# Patient Record
Sex: Female | Born: 1950 | Race: Black or African American | Hispanic: No | Marital: Married | Smoking: Never smoker
Health system: Southern US, Community
[De-identification: ages and names within clinical notes are randomized; demographics above are authoritative.]

## PROBLEM LIST (undated history)

## (undated) DIAGNOSIS — I10 Essential (primary) hypertension: Secondary | ICD-10-CM

## (undated) HISTORY — PX: CORNEAL TRANSPLANT: SHX108

## (undated) HISTORY — PX: ABDOMINAL HYSTERECTOMY: SHX81

## (undated) HISTORY — DX: Essential (primary) hypertension: I10

---

## 2010-03-05 ENCOUNTER — Ambulatory Visit: Payer: Self-pay

## 2010-03-15 ENCOUNTER — Ambulatory Visit: Payer: Self-pay

## 2010-11-19 ENCOUNTER — Emergency Department (HOSPITAL_COMMUNITY)
Admission: EM | Admit: 2010-11-19 | Discharge: 2010-11-19 | Disposition: A | Discharge status: Home / Self Care | Payer: BC Managed Care – PPO | Attending: Emergency Medicine | Admitting: Emergency Medicine

## 2010-11-19 DIAGNOSIS — L723 Sebaceous cyst: Secondary | ICD-10-CM | POA: Insufficient documentation

## 2010-11-19 DIAGNOSIS — I10 Essential (primary) hypertension: Secondary | ICD-10-CM | POA: Insufficient documentation

## 2012-01-05 ENCOUNTER — Ambulatory Visit: Payer: Self-pay | Admitting: Emergency Medicine

## 2012-05-27 ENCOUNTER — Ambulatory Visit: Payer: Self-pay | Admitting: Oncology

## 2012-07-04 ENCOUNTER — Ambulatory Visit: Payer: Self-pay | Admitting: Oncology

## 2012-07-04 LAB — COMPREHENSIVE METABOLIC PANEL
BUN: 12 mg/dL (ref 7–18)
Bilirubin,Total: 0.4 mg/dL (ref 0.2–1.0)
Calcium, Total: 9.8 mg/dL (ref 8.5–10.1)
Chloride: 99 mmol/L (ref 98–107)
Co2: 28 mmol/L (ref 21–32)
EGFR (African American): >60
Potassium: 3.7 mmol/L (ref 3.5–5.1)
SGOT(AST): 23 U/L (ref 15–37)
SGPT (ALT): 32 U/L (ref 12–78)
Total Protein: 8.5 g/dL — ABNORMAL HIGH (ref 6.4–8.2)

## 2012-07-04 LAB — CBC CANCER CENTER
Basophil #: 0.1 x10 3/mm (ref 0.0–0.1)
Basophil %: 1.1 %
Eosinophil #: 0.2 x10 3/mm (ref 0.0–0.7)
HGB: 14.3 g/dL (ref 12.0–16.0)
Lymphocyte #: 2 x10 3/mm (ref 1.0–3.6)
Lymphocyte %: 36.2 %
MCH: 28 pg (ref 26.0–34.0)
MCHC: 32.9 g/dL (ref 32.0–36.0)
MCV: 85 fL (ref 80–100)
Monocyte #: 0.5 x10 3/mm (ref 0.2–0.9)
Monocyte %: 8.1 %
Neutrophil #: 2.9 x10 3/mm (ref 1.4–6.5)
Neutrophil %: 51.7 %
Platelet: 216 x10 3/mm (ref 150–440)
RBC: 5.12 10*6/uL (ref 3.80–5.20)
RDW: 14.2 % (ref 11.5–14.5)

## 2012-07-04 LAB — IRON AND TIBC
Iron Saturation: 28 %
Iron: 90 ug/dL (ref 50–170)
Unbound Iron-Bind.Cap.: 237 ug/dL

## 2012-07-04 LAB — FERRITIN: Ferritin (ARMC): 328 ng/mL (ref 8–388)

## 2012-07-06 ENCOUNTER — Ambulatory Visit: Payer: Self-pay | Admitting: Oncology

## 2012-08-01 LAB — CBC CANCER CENTER
Basophil #: 0.1 x10 3/mm (ref 0.0–0.1)
Basophil %: 1.2 %
Eosinophil #: 0.2 x10 3/mm (ref 0.0–0.7)
Eosinophil %: 3.8 %
Lymphocyte %: 41.4 %
MCH: 27.8 pg (ref 26.0–34.0)
MCV: 85 fL (ref 80–100)
Monocyte #: 0.6 x10 3/mm (ref 0.2–0.9)
Neutrophil #: 2.9 x10 3/mm (ref 1.4–6.5)
Neutrophil %: 44.3 %
Platelet: 211 x10 3/mm (ref 150–440)
RBC: 4.91 10*6/uL (ref 3.80–5.20)
WBC: 6.5 x10 3/mm (ref 3.6–11.0)

## 2012-08-03 ENCOUNTER — Ambulatory Visit: Payer: Self-pay | Admitting: Oncology

## 2014-09-04 ENCOUNTER — Encounter (HOSPITAL_COMMUNITY): Payer: Self-pay

## 2014-09-04 ENCOUNTER — Emergency Department (HOSPITAL_COMMUNITY)
Admission: EM | Admit: 2014-09-04 | Discharge: 2014-09-04 | Disposition: A | Discharge status: Home / Self Care | Payer: BC Managed Care – PPO | Attending: Emergency Medicine | Admitting: Emergency Medicine

## 2014-09-04 DIAGNOSIS — I10 Essential (primary) hypertension: Secondary | ICD-10-CM | POA: Insufficient documentation

## 2014-09-04 DIAGNOSIS — G4489 Other headache syndrome: Secondary | ICD-10-CM | POA: Diagnosis not present

## 2014-09-04 DIAGNOSIS — Z79899 Other long term (current) drug therapy: Secondary | ICD-10-CM | POA: Diagnosis not present

## 2014-09-04 DIAGNOSIS — Z7982 Long term (current) use of aspirin: Secondary | ICD-10-CM | POA: Insufficient documentation

## 2014-09-04 DIAGNOSIS — R51 Headache: Secondary | ICD-10-CM | POA: Diagnosis present

## 2014-09-04 HISTORY — DX: Essential (primary) hypertension: I10

## 2014-09-04 NOTE — ED Provider Notes (Signed)
CSN: [id number]     Arrival date & time 09/04/14  2133 History  This chart was scribed for Ripley Fraise, MD by Eustaquio Maize, ED Scribe. This patient was seen in room APA15/APA15 and the patient's care was started at 11:10 PM.    Chief Complaint  Patient presents with  . Headache    Patient is a 64 y.o. female presenting with headaches. The history is provided by the patient. No language interpreter was used.  Headache Pain location:  Occipital Quality:  Unable to specify Radiates to:  Does not radiate Severity currently:  5/10 Severity at highest:  Unable to specify Onset quality:  Sudden Duration:  1 day Timing:  Constant Chronicity:  New Associated symptoms: no abdominal pain, no blurred vision, no dizziness, no fever, no nausea, no visual change, no vomiting and no weakness    HPI Comments: Renee Boone is a 64 y.o. female with PMHx HTN who presents to the Emergency Department complaining of right sided head pain that began 1 day ago. Pt states that she was sitting down when the pain began. Pt reports that the pain is a 5/10 on the pain scale. She also mentions a knot on her head that she noticed 1 week ago. She denies pain radiation or injury. No fever, nausea, vomiting, visual changes, slurred speech, weakness in extremities, dizziness, chest pain, abdominal pain, or any other symptoms. Pt states she has never had these symptoms before.   Past Medical History  Diagnosis Date  . Hypertension    History reviewed. No pertinent past surgical history. No family history on file. History  Substance Use Topics  . Smoking status: Never Smoker   . Smokeless tobacco: Not on file  . Alcohol Use: No   OB History    No data available     Review of Systems  Constitutional: Negative for fever.  Eyes: Negative for blurred vision.  Cardiovascular: Negative for chest pain.  Gastrointestinal: Negative for nausea, vomiting and abdominal pain.  Neurological: Positive for headaches.  Negative for dizziness, speech difficulty and weakness.  All other systems reviewed and are negative.     Allergies  Review of patient's allergies indicates no known allergies.  Home Medications   Prior to Admission medications   Medication Sig Start Date End Date Taking? Authorizing Provider  aspirin EC 81 MG tablet Take 81 mg by mouth daily as needed for mild pain.   Yes Historical Provider, MD  Ergocalciferol (VITAMIN D2) 400 UNITS TABS Take 800 mcg by mouth daily.   Yes Historical Provider, MD  Multiple Vitamin (MULTIVITAMIN WITH MINERALS) TABS tablet Take 1 tablet by mouth daily.   Yes Historical Provider, MD  triamterene-hydrochlorothiazide (MAXZIDE-25) 37.5-25 MG per tablet Take 1 tablet by mouth daily. 08/19/14  Yes Historical Provider, MD   Triage Vitals: BP 152/84 mmHg  Pulse 67  Temp(Src) 98.2 F (36.8 C) (Oral)  Resp 18  Ht 5\' 4"  (1.626 m)  Wt 176 lb (79.833 kg)  BMI 30.20 kg/m2  SpO2 99%   Physical Exam  CONSTITUTIONAL: Well developed/well nourished HEAD: Normocephalic/atraumatic, no signs of trauma.  No edema. No abscess.  No lymphadenopathy EYES: EOMI/PERRL, no nystagmus, no ptosis ENMT: Mucous membranes moist NECK: supple no meningeal signs, no bruits SPINE/BACK:entire spine nontender CV: S1/S2 noted, no murmurs/rubs/gallops noted LUNGS: Lungs are clear to auscultation bilaterally, no apparent distress ABDOMEN: soft, nontender, no rebound or guarding GU:no cva tenderness NEURO:Awake/alert, facies symmetric, no arm or leg drift is noted Equal 5/5 strength with shoulder  abduction, elbow flex/extension, wrist flex/extension in upper extremities and equal hand grips bilaterally Equal 5/5 strength with hip flexion,knee flex/extension, foot dorsi/plantar flexion Cranial nerves 3/4/5/6/12/11/08/11/12 tested and intact Gait normal without ataxia No past pointing Sensation to light touch intact in all extremities EXTREMITIES: pulses normal, full ROM SKIN: warm,  color normal PSYCH: no abnormalities of mood noted, alert and oriented to situation   ED Course  Procedures   DIAGNOSTIC STUDIES: Oxygen Saturation is 99% on RA, normal by my interpretation.    COORDINATION OF CARE: 11:15 PM-Discussed treatment plan which includes follow up with PCP with pt at bedside and pt agreed to plan.   Pt well appearing She has one area of her head that she reports is hurting She is watching TV, smiling, ambulatory She has no associated symptoms I doubt SAH or other acute neurologic emergency We discussed strict return precautions   MDM   Final diagnoses:  Other headache syndrome  Essential hypertension    Nursing notes including past medical history and social history reviewed and considered in documentation   I personally performed the services described in this documentation, which was scribed in my presence. The recorded information has been reviewed and is accurate.       Ripley Fraise, MD 09/05/14 [phone]

## 2014-09-04 NOTE — ED Notes (Signed)
Point tender pain right posterior head for 2 days, denies other symptoms, cannot say whether its a scalp pain or headache.

## 2014-09-04 NOTE — Discharge Instructions (Signed)

## 2016-12-05 ENCOUNTER — Other Ambulatory Visit (HOSPITAL_COMMUNITY): Payer: Self-pay | Admitting: Emergency Medicine

## 2016-12-05 ENCOUNTER — Ambulatory Visit (HOSPITAL_COMMUNITY)
Admission: RE | Admit: 2016-12-05 | Discharge: 2016-12-05 | Disposition: A | Discharge status: Home / Self Care | Payer: BC Managed Care – PPO | Source: Ambulatory Visit | Attending: Emergency Medicine | Admitting: Emergency Medicine

## 2016-12-05 DIAGNOSIS — M7989 Other specified soft tissue disorders: Secondary | ICD-10-CM | POA: Diagnosis present

## 2017-03-28 ENCOUNTER — Telehealth: Payer: Self-pay

## 2017-03-28 NOTE — Telephone Encounter (Signed)
Patient received letter to schedule tcs

## 2017-04-02 NOTE — Telephone Encounter (Signed)
PT has previous colonoscopy in [place], with Dr. Posey Pronto. She said she has a hx of polyps. She will call and have report sent to me and then she will be triaged. She is not having any problems at this time and no family hx of colon cancer.

## 2017-04-12 NOTE — Telephone Encounter (Signed)
Received the report on last colonoscopy. It was done 04/15/2009 by Dr. Posey Pronto. Pt had one hyperplastic polyp. She said she had a previous colonoscopy in 2008, but the prep was not good. She is aware if you have hyperplastic polyps, the plan is to repeat every 10 years. She is not having any problems and no family hx.  She will call if she needs anything.

## 2017-04-25 ENCOUNTER — Encounter (HOSPITAL_COMMUNITY): Payer: Self-pay | Admitting: Emergency Medicine

## 2017-04-25 ENCOUNTER — Emergency Department (HOSPITAL_COMMUNITY)
Admission: EM | Admit: 2017-04-25 | Discharge: 2017-04-25 | Disposition: A | Discharge status: Home / Self Care | Payer: BC Managed Care – PPO | Attending: Emergency Medicine | Admitting: Emergency Medicine

## 2017-04-25 ENCOUNTER — Other Ambulatory Visit: Payer: Self-pay

## 2017-04-25 DIAGNOSIS — Z7982 Long term (current) use of aspirin: Secondary | ICD-10-CM | POA: Insufficient documentation

## 2017-04-25 DIAGNOSIS — J029 Acute pharyngitis, unspecified: Secondary | ICD-10-CM | POA: Insufficient documentation

## 2017-04-25 DIAGNOSIS — R0981 Nasal congestion: Secondary | ICD-10-CM | POA: Diagnosis not present

## 2017-04-25 DIAGNOSIS — Z79899 Other long term (current) drug therapy: Secondary | ICD-10-CM | POA: Insufficient documentation

## 2017-04-25 DIAGNOSIS — I10 Essential (primary) hypertension: Secondary | ICD-10-CM | POA: Diagnosis not present

## 2017-04-25 DIAGNOSIS — R0989 Other specified symptoms and signs involving the circulatory and respiratory systems: Secondary | ICD-10-CM | POA: Insufficient documentation

## 2017-04-25 DIAGNOSIS — R05 Cough: Secondary | ICD-10-CM | POA: Insufficient documentation

## 2017-04-25 LAB — RAPID STREP SCREEN (MED CTR MEBANE ONLY): Streptococcus, Group A Screen (Direct): NEGATIVE

## 2017-04-25 MED ORDER — SUCRALFATE 1 GM/10ML PO SUSP: 1.0000 g | Freq: Three times a day (TID) | ORAL | 0 refills | Status: DC

## 2017-04-25 MED ORDER — MAGIC MOUTHWASH W/LIDOCAINE: 5.0000 mL | Freq: Three times a day (TID) | ORAL | 0 refills | Status: DC | PRN

## 2017-04-25 NOTE — ED Triage Notes (Signed)
Pt c/o states she started with cold symptoms over the weekend and now c/o sore throat when she swallows.

## 2017-04-25 NOTE — Discharge Instructions (Signed)
You may also use over-the-counter Mucinex as directed.  Tylenol if needed for pain or fever.  Follow-up with your primary provider or return to the ER for any worsening symptoms.

## 2017-04-26 NOTE — ED Provider Notes (Signed)
[facility] EMERGENCY DEPARTMENT Provider Note   CSN: [id number] Arrival date & time: 04/25/17  1932     History   Chief Complaint Chief Complaint  Patient presents with  . Sore Throat    HPI Renee Boone is a 66 y.o. female.  HPI   Renee Boone is a 66 y.o. female who presents to the Emergency Department complaining of sore throat.  Symptoms began several days ago with nasal congestion, runny nose and cough.  Her cold and cough symptoms have improved, but she continues to have pain with swallowing.  She was eating lunch earlier and felt worsening pain to her throat when trying to eat salad and chicken and she now describes having pain at the base of her neck with swallowing and this pain is not constant.  She denies chest pain, shortness of breath, vomiting and inability or difficulty with swallowing liquids.    Past Medical History:  Diagnosis Date  . Hypertension     There are no active problems to display for this patient.   Past Surgical History:  Procedure Laterality Date  . ABDOMINAL HYSTERECTOMY      OB History    No data available       Home Medications    Prior to Admission medications   Medication Sig Start Date End Date Taking? Authorizing Provider  aspirin EC 81 MG tablet Take 81 mg by mouth daily as needed for mild pain.    [provider]  Ergocalciferol (VITAMIN D2) 400 UNITS TABS Take 800 mcg by mouth daily.    [provider]  magic mouthwash w/lidocaine SOLN Take 5 mLs by mouth 3 (three) times daily as needed for mouth pain. Swish and spit, do not swallow 04/25/17   Mitsuo Budnick, PA-C  Multiple Vitamin (MULTIVITAMIN WITH MINERALS) TABS tablet Take 1 tablet by mouth daily.    [provider]  sucralfate (CARAFATE) 1 GM/10ML suspension Take 10 mLs (1 g total) by mouth 4 (four) times daily -  with meals and at bedtime. 04/25/17   Isreal Moline, PA-C  triamterene-hydrochlorothiazide (MAXZIDE-25) 37.5-25 MG per tablet  Take 1 tablet by mouth daily. 08/19/14   [provider]    Family History No family history on file.  Social History Social History   Tobacco Use  . Smoking status: Never Smoker  Substance Use Topics  . Alcohol use: No  . Drug use: No     Allergies   Patient has no known allergies.   Review of Systems Review of Systems  Constitutional: Negative for activity change, appetite change, chills and fever.  HENT: Positive for congestion, sore throat and trouble swallowing. Negative for ear pain, facial swelling and voice change.   Eyes: Negative for pain and visual disturbance.  Respiratory: Positive for cough. Negative for shortness of breath.   Gastrointestinal: Negative for abdominal pain, nausea and vomiting.  Musculoskeletal: Negative for arthralgias, neck pain and neck stiffness.  Skin: Negative for color change and rash.  Neurological: Negative for dizziness, facial asymmetry, speech difficulty, numbness and headaches.  Hematological: Negative for adenopathy.  All other systems reviewed and are negative.    Physical Exam Updated Vital Signs BP 137/90   Pulse 72   Temp 97.9 F (36.6 C)   Resp 18   Ht 5\' 3"  (1.6 m)   Wt 79.4 kg (175 lb)   SpO2 97%   BMI 31.00 kg/m   Physical Exam  Constitutional: She is oriented to person, place, and time. She appears  well-developed and well-nourished. No distress.  HENT:  Head: Normocephalic and atraumatic.  Right Ear: Tympanic membrane and ear canal normal.  Left Ear: Tympanic membrane and ear canal normal.  Mouth/Throat: Uvula is midline and mucous membranes are normal. No trismus in the jaw. No uvula swelling. Posterior oropharyngeal erythema present. No oropharyngeal exudate, posterior oropharyngeal edema or tonsillar abscesses.  Neck: Normal range of motion. Neck supple.  Cardiovascular: Normal rate and regular rhythm.  Pulmonary/Chest: Effort normal and breath sounds normal. No respiratory distress.  Abdominal:  Soft. There is no splenomegaly. There is no tenderness.  Musculoskeletal: Normal range of motion.  Lymphadenopathy:    She has no cervical adenopathy.  Neurological: She is alert and oriented to person, place, and time. No sensory deficit. She exhibits normal muscle tone. Coordination normal.  Skin: Skin is warm and dry. Capillary refill takes less than 2 seconds. No rash noted.  Psychiatric: She has a normal mood and affect.  Nursing note and vitals reviewed.    ED Treatments / Results  Labs (all labs ordered are listed, but only abnormal results are displayed) Labs Reviewed  RAPID STREP SCREEN (NOT AT [facility])  CULTURE, GROUP A STREP [facility])    EKG  EKG Interpretation None       Radiology No results found.  Procedures Procedures (including critical care time)  Medications Ordered in ED Medications - No data to display   Initial Impression / Assessment and Plan / ED Course  I have reviewed the triage vital signs and the nursing notes.  Pertinent labs & imaging results that were available during my care of the patient were reviewed by me and considered in my medical decision making (see chart for details).     Pt well appearing.  Airway patent.  Strep neg.  Handles own secretions well and also tolerating po fluids.  No concerning sx's for food bolus or PTA.  She appears stable for d/c, will tx with Carafate liquid and magic mouthwash.  Advised to use tylenol if needed.  Return precautions discussed.   Final Clinical Impressions(s) / ED Diagnoses   Final diagnoses:  Sore throat    ED Discharge Orders        Ordered    magic mouthwash w/lidocaine SOLN  3 times daily PRN     04/25/17 2043    sucralfate (CARAFATE) 1 GM/10ML suspension  3 times daily with meals & bedtime     04/25/17 2043       Kem Parkinson, PA-C 99/35/70 1779    Delora Fuel, MD 39/03/00 2241

## 2017-04-28 LAB — CULTURE, GROUP A STREP (THRC)

## 2017-05-21 NOTE — Telephone Encounter (Signed)
Yes, I agree. Thanks!

## 2017-05-21 NOTE — Telephone Encounter (Signed)
Renee Boone, did you see this?

## 2018-06-10 ENCOUNTER — Ambulatory Visit (INDEPENDENT_AMBULATORY_CARE_PROVIDER_SITE_OTHER): Payer: Self-pay

## 2018-06-10 DIAGNOSIS — Z1211 Encounter for screening for malignant neoplasm of colon: Secondary | ICD-10-CM

## 2018-06-10 MED ORDER — NA SULFATE-K SULFATE-MG SULF 17.5-3.13-1.6 GM/177ML PO SOLN: 1.0000 | ORAL | 0 refills | Status: DC

## 2018-06-10 NOTE — Progress Notes (Signed)
Gastroenterology Pre-Procedure Review  Request Date:06/10/18 Requesting Physician: Vergas- previous tcs 04/15/2009 Dr.Patel- hyperplastic polyp  PATIENT REVIEW QUESTIONS: The patient responded to the following health history questions as indicated:    1. Diabetes Melitis: no 2. Joint replacements in the past 12 months: no 3. Major health problems in the past 3 months: no 4. Has an artificial valve or MVP: no 5. Has a defibrillator: no 6. Has been advised in past to take antibiotics in advance of a procedure like teeth cleaning: no 7. Family history of colon cancer: no  8. Alcohol Use: no 9. History of sleep apnea: no  10. History of coronary artery or other vascular stents placed within the last 12 months: no 11. History of any prior anesthesia complications: no    MEDICATIONS & ALLERGIES:    Patient reports the following regarding taking any blood thinners:   Plavix? no Aspirin? no Coumadin? no Brilinta? no Xarelto? no Eliquis? no Pradaxa? no Savaysa? no Effient? no  Patient confirms/reports the following medications:  Current Outpatient Medications  Medication Sig Dispense Refill  . Ascorbic Acid (VITAMIN C) 100 MG tablet Take 100 mg by mouth daily.    . Ergocalciferol (VITAMIN D2) 400 UNITS TABS Take 800 mcg by mouth daily.    . Multiple Vitamin (MULTIVITAMIN WITH MINERALS) TABS tablet Take 1 tablet by mouth daily.    Marland Kitchen triamterene-hydrochlorothiazide (MAXZIDE-25) 37.5-25 MG per tablet Take 1 tablet by mouth daily.     No current facility-administered medications for this visit.     Patient confirms/reports the following allergies:  No Known Allergies  No orders of the defined types were placed in this encounter.   AUTHORIZATION INFORMATION Primary Insurance: [employer],  ID #: [id number] Pre-Cert / Josem Kaufmann required: no  SCHEDULE INFORMATION: Procedure has been scheduled as follows:  Date: 08/26/18, Time: 12:00 Location: [facility] Dr[facility]  This  Gastroenterology Pre-Precedure Review Form is being routed to the following provider(s): Neil Crouch PA

## 2018-06-10 NOTE — Patient Instructions (Addendum)
Renee Boone  Dec 09, 1950 MRN: [id number]     Procedure Date: 11/25/2018 Time to register: 11:00am Place to register: [facility] Procedure Time: 12:00pm Scheduled provider: Barney Drain, MD    PREPARATION FOR COLONOSCOPY WITH SUPREP BOWEL PREP KIT  Note: Suprep Bowel Prep Kit is a split-dose (2day) regimen. Consumption of BOTH 6-ounce bottles is required for a complete prep.  Please notify us immediately if you are diabetic, take iron supplements, or if you are on Coumadin or any other blood thinners.                                                                                                                                                 1 DAY BEFORE PROCEDURE:  DATE: 11/24/2018   DAY: Sunday  clear liquids the entire day - NO SOLID FOOD.   At 6:00pm: Complete steps 1 through 4 below, using ONE (1) 6-ounce bottle, before going to bed. Step 1:  Pour ONE (1) 6-ounce bottle of SUPREP liquid into the mixing container.  Step 2:  Add cool drinking water to the 16 ounce line on the container and mix.  Note: Dilute the solution concentrate as directed prior to use. Step 3:  DRINK ALL the liquid in the container. Step 4:  You MUST drink an additional two (2) or more 16 ounce containers of water over the next one (1) hour.   Continue clear liquids.  DAY OF PROCEDURE:   DATE: 11/25/2018   DAY: Monday If you take medications for your heart, blood pressure, or breathing, you may take these medications.   5 hours before your procedure at :7:00am Step 1:  Pour ONE (1) 6-ounce bottle of SUPREP liquid into the mixing container.  Step 2:  Add cool drinking water to the 16 ounce line on the container and mix.  Note: Dilute the solution concentrate as directed prior to use. Step 3:  DRINK ALL the liquid in the container. Step 4:  You MUST drink an additional two (2) or more 16 ounce containers of water over the next one (1) hour. You MUST complete the final glass of water at least 3  hours before your colonoscopy.   Nothing by mouth past 9:00am  You may take your morning medications with sip of water unless we have instructed otherwise.    Please see below for Dietary Information.  CLEAR LIQUIDS INCLUDE:  Water Jello (NOT red in color)   Ice Popsicles (NOT red in color)   Tea (sugar ok, no milk/cream) Powdered fruit flavored drinks  Coffee (sugar ok, no milk/cream) Gatorade/ Lemonade/ Kool-Aid  (NOT red in color)   Juice: apple, white grape, white cranberry Soft drinks  Clear bullion, consomme, broth (fat free beef/chicken/vegetable)  Carbonated beverages (any kind)  Strained chicken noodle soup Hard Candy   Remember: Clear liquids are liquids that will allow  you to see your fingers on the other side of a clear glass. Be sure liquids are NOT red in color, and not cloudy, but CLEAR.  DO NOT EAT OR DRINK ANY OF THE FOLLOWING:  Dairy products of any kind   Cranberry juice Tomato juice / V8 juice   Grapefruit juice Orange juice     Red grape juice  Do not eat any solid foods, including such foods as: cereal, oatmeal, yogurt, fruits, vegetables, creamed soups, eggs, bread, crackers, pureed foods in a blender, etc.   HELPFUL HINTS FOR DRINKING PREP SOLUTION:   Make sure prep is extremely cold. Mix and refrigerate the the morning of the prep. You may also put in the freezer.   You may try mixing some Crystal Light or Country Time Lemonade if you prefer. Mix in small amounts; add more if necessary.  Try drinking through a straw  Rinse mouth with water or a mouthwash between glasses, to remove after-taste.  Try sipping on a cold beverage /ice/ popsicles between glasses of prep.  Place a piece of sugar-free hard candy in mouth between glasses.  If you become nauseated, try consuming smaller amounts, or stretch out the time between glasses. Stop for 30-60 minutes, then slowly start back drinking.     OTHER INSTRUCTIONS  You will need a responsible adult at  least 66 years of age to accompany you and drive you home. This person must remain in the waiting room during your procedure. The hospital will cancel your procedure if you do not have a responsible adult with you.   1. Wear loose fitting clothing that is easily removed. 2. Leave jewelry and other valuables at home.  3. Remove all body piercing jewelry and leave at home. 4. Total time from sign-in until discharge is approximately 2-3 hours. 5. You should go home directly after your procedure and rest. You can resume normal activities the day after your procedure. 6. The day of your procedure you should not:  Drive  Make legal decisions  Operate machinery  Drink alcohol  Return to work   You may call the office (Dept: [phone]) before 5:00pm, or page the doctor on call [phone]) after 5:00pm, for further instructions, if necessary.   Insurance Information YOU WILL NEED TO CHECK WITH YOUR INSURANCE COMPANY FOR THE BENEFITS OF COVERAGE YOU HAVE FOR THIS PROCEDURE.  UNFORTUNATELY, NOT ALL INSURANCE COMPANIES HAVE BENEFITS TO COVER ALL OR PART OF THESE TYPES OF PROCEDURES.  IT IS YOUR RESPONSIBILITY TO CHECK YOUR BENEFITS, HOWEVER, WE WILL BE GLAD TO ASSIST YOU WITH ANY CODES YOUR INSURANCE COMPANY MAY NEED.    PLEASE NOTE THAT MOST INSURANCE COMPANIES WILL NOT COVER A SCREENING COLONOSCOPY FOR PEOPLE UNDER THE AGE OF 50  IF YOU HAVE BC[employer] INSURANCE, YOU MAY HAVE BENEFITS FOR A SCREENING COLONOSCOPY BUT IF POLYPS ARE FOUND THE DIAGNOSIS WILL CHANGE AND THEN YOU MAY HAVE A DEDUCTIBLE THAT WILL NEED TO BE MET. SO PLEASE MAKE SURE YOU CHECK YOUR BENEFITS FOR A SCREENING COLONOSCOPY AS WELL AS A DIAGNOSTIC COLONOSCOPY.

## 2018-06-11 NOTE — Progress Notes (Signed)
OK to schedule

## 2018-08-19 NOTE — Progress Notes (Signed)
Pt called to re-schedule her colonoscopy appointment.  Pt re-scheduled to 11/25/2018 and is aware.  Mailed new instructions to pt.  Endo notified as well.

## 2018-11-18 ENCOUNTER — Telehealth: Payer: Self-pay | Admitting: *Deleted

## 2018-11-18 NOTE — Telephone Encounter (Signed)
Pt is scheduled for her COVID 19 screening on 11/21/2018.  Pt is aware to remain in quarantine once testing is done.  Pt voiced understanding.

## 2018-11-21 ENCOUNTER — Other Ambulatory Visit (HOSPITAL_COMMUNITY)
Admission: RE | Admit: 2018-11-21 | Discharge: 2018-11-21 | Disposition: A | Discharge status: Home / Self Care | Payer: Medicare Other | Source: Ambulatory Visit | Attending: Gastroenterology | Admitting: Gastroenterology

## 2018-11-21 ENCOUNTER — Other Ambulatory Visit: Payer: Self-pay

## 2018-11-21 ENCOUNTER — Telehealth: Payer: Self-pay

## 2018-11-21 DIAGNOSIS — Z1159 Encounter for screening for other viral diseases: Secondary | ICD-10-CM | POA: Insufficient documentation

## 2018-11-21 NOTE — Telephone Encounter (Signed)
Called pt to see if she wanted to arrive at 7:30am 11/25/18 for TCS but she wanted to stay at 12:00pm as already scheduled. LMOVM for endo scheduler.

## 2018-11-22 LAB — NOVEL CORONAVIRUS, NAA (HOSP ORDER, SEND-OUT TO REF LAB; TAT 18-24 HRS): SARS-CoV-2, NAA: NOT DETECTED

## 2018-11-25 ENCOUNTER — Encounter (HOSPITAL_COMMUNITY)
Admission: RE | Disposition: A | Discharge status: Home / Self Care | Payer: Self-pay | Source: Home / Self Care | Attending: Gastroenterology

## 2018-11-25 ENCOUNTER — Other Ambulatory Visit: Payer: Self-pay

## 2018-11-25 ENCOUNTER — Encounter (HOSPITAL_COMMUNITY): Payer: Self-pay

## 2018-11-25 ENCOUNTER — Ambulatory Visit (HOSPITAL_COMMUNITY)
Admission: RE | Admit: 2018-11-25 | Discharge: 2018-11-25 | Disposition: A | Discharge status: Home / Self Care | Payer: Medicare Other | Attending: Gastroenterology | Admitting: Gastroenterology

## 2018-11-25 DIAGNOSIS — K635 Polyp of colon: Secondary | ICD-10-CM | POA: Diagnosis not present

## 2018-11-25 DIAGNOSIS — K648 Other hemorrhoids: Secondary | ICD-10-CM | POA: Diagnosis not present

## 2018-11-25 DIAGNOSIS — Q439 Congenital malformation of intestine, unspecified: Secondary | ICD-10-CM | POA: Insufficient documentation

## 2018-11-25 DIAGNOSIS — D125 Benign neoplasm of sigmoid colon: Secondary | ICD-10-CM | POA: Diagnosis not present

## 2018-11-25 DIAGNOSIS — K644 Residual hemorrhoidal skin tags: Secondary | ICD-10-CM | POA: Insufficient documentation

## 2018-11-25 DIAGNOSIS — Z1211 Encounter for screening for malignant neoplasm of colon: Secondary | ICD-10-CM

## 2018-11-25 DIAGNOSIS — I10 Essential (primary) hypertension: Secondary | ICD-10-CM | POA: Insufficient documentation

## 2018-11-25 HISTORY — PX: POLYPECTOMY: SHX5525

## 2018-11-25 HISTORY — PX: COLONOSCOPY: SHX5424

## 2018-11-25 SURGERY — COLONOSCOPY
Anesthesia: Moderate Sedation

## 2018-11-25 MED ORDER — SODIUM CHLORIDE 0.9 % IV SOLN
INTRAVENOUS | Status: DC
  Administered 2018-11-25: 11:00:00 via INTRAVENOUS

## 2018-11-25 MED ORDER — MEPERIDINE HCL 100 MG/ML IJ SOLN
INTRAMUSCULAR | Status: AC
  Filled 2018-11-25: qty 2

## 2018-11-25 MED ORDER — STERILE WATER FOR IRRIGATION IR SOLN
Status: DC | PRN
  Administered 2018-11-25: 1.5 mL

## 2018-11-25 MED ORDER — MIDAZOLAM HCL 5 MG/5ML IJ SOLN
INTRAMUSCULAR | Status: AC
  Filled 2018-11-25: qty 10

## 2018-11-25 MED ORDER — MIDAZOLAM HCL 5 MG/5ML IJ SOLN
INTRAMUSCULAR | Status: DC | PRN
  Administered 2018-11-25 (×2): 2 mg via INTRAVENOUS

## 2018-11-25 MED ORDER — MEPERIDINE HCL 100 MG/ML IJ SOLN
INTRAMUSCULAR | Status: DC | PRN
  Administered 2018-11-25 (×2): 25 mg via INTRAVENOUS

## 2018-11-25 NOTE — H&P (Addendum)
\ Primary Care Physician:  [facility] Primary Gastroenterologist:  Dr. Oneida Alar  Pre-Procedure History & Physical: HPI:  Renee Boone is a 68 y.o. female here for  [place].  Past Medical History:  Diagnosis Date  . Hypertension     Past Surgical History:  Procedure Laterality Date  . ABDOMINAL HYSTERECTOMY      Prior to Admission medications   Medication Sig Start Date End Date Taking? Authorizing Provider  Ascorbic Acid (VITAMIN C) 100 MG tablet Take 100 mg by mouth daily.   Yes [provider]  Ergocalciferol (VITAMIN D2) 400 UNITS TABS Take 800 mcg by mouth daily.   Yes [provider]  Multiple Vitamin (MULTIVITAMIN WITH MINERALS) TABS tablet Take 1 tablet by mouth daily.   Yes [provider]  Na Sulfate-K Sulfate-Mg Sulf (SUPREP BOWEL PREP KIT) 17.5-3.13-1.6 GM/177ML SOLN Take 1 kit by mouth as directed. 06/10/18  Yes Mahala Menghini, PA-C  triamterene-hydrochlorothiazide (MAXZIDE-25) 37.5-25 MG per tablet Take 1 tablet by mouth daily. 08/19/14  Yes [provider]    Allergies as of 06/10/2018  . (No Known Allergies)    History reviewed. No pertinent family history.  Social History   Socioeconomic History  . Marital status: Married    Spouse name: Not on file  . Number of children: Not on file  . Years of education: Not on file  . Highest education level: Not on file  Occupational History  . Not on file  Social Needs  . Financial resource strain: Not on file  . Food insecurity    Worry: Not on file    Inability: Not on file  . Transportation needs    Medical: Not on file    Non-medical: Not on file  Tobacco Use  . Smoking status: Never Smoker  . Smokeless tobacco: Never Used  Substance and Sexual Activity  . Alcohol use: No  . Drug use: No  . Sexual activity: Not on file  Lifestyle  . Physical activity    Days per week: Not on file    Minutes per session: Not on file  .  Stress: Not on file  Relationships  . Social Herbalist on phone: Not on file    Gets together: Not on file    Attends religious service: Not on file    Active member of club or organization: Not on file    Attends meetings of clubs or organizations: Not on file    Relationship status: Not on file  . Intimate partner violence    Fear of current or ex partner: Not on file    Emotionally abused: Not on file    Physically abused: Not on file    Forced sexual activity: Not on file  Other Topics Concern  . Not on file  Social History Narrative  . Not on file    Review of Systems: See HPI, otherwise negative ROS   Physical Exam: BP 131/82   Pulse 74   Temp 98.2 F (36.8 C) (Oral)   Resp 17   Ht 5' 6"  (1.676 m)   Wt 78.9 kg   SpO2 96%   BMI 28.08 kg/m  General:   Alert,  pleasant and cooperative in NAD Head:  Normocephalic and atraumatic. Neck:  Supple; Lungs:  Clear throughout to auscultation.    Heart:  Regular rate and rhythm. Abdomen:  Soft, nontender and nondistended. Normal bowel sounds, without guarding, and without rebound.  Neurologic:  Alert and  oriented x4;  grossly normal neurologically.  Impression/Plan:     COLON CANCER SCREENING.  PLAN: 1. TCS TODAY. DISCUSSED PROCEDURE, BENEFITS, & RISKS: < 1% chance of medication reaction, bleeding, perforation, ASPIRATION, or rupture of spleen/liver requiring surgery to fix it and missed polyps < 1 cm 10-20% of the time.

## 2018-11-25 NOTE — Op Note (Signed)
[facility] Patient Name: Renee Boone Procedure Date: 11/25/2018 11:32 AM MRN: [id number] Date of Birth: 10/05/1950 Attending MD: Barney Drain MD, MD CSN: [id number] Age: 68 Admit Type: Outpatient Procedure:                Colonoscopy WITH COLD SNARE POLYPECTOMY Indications:              Screening for colorectal malignant neoplasm Providers:                Barney Drain MD, MD, Charlsie Quest. Theda Sers RN, RN,                            Nelma Rothman, Technician Referring MD:             Vidal Schwalbe Medicines:                Meperidine 50 mg IV, Midazolam 4 mg IV Complications:            No immediate complications. Estimated Blood Loss:     Estimated blood loss was minimal. Procedure:                Pre-Anesthesia Assessment:                           - Prior to the procedure, a History and Physical                            was performed, and patient medications and                            allergies were reviewed. The patient's tolerance of                            previous anesthesia was also reviewed. The risks                            and benefits of the procedure and the sedation                            options and risks were discussed with the patient.                            All questions were answered, and informed consent                            was obtained. Prior Anticoagulants: The patient has                            taken no previous anticoagulant or antiplatelet                            agents. ASA Grade Assessment: II - A patient with                            mild systemic disease. After reviewing the risks  and benefits, the patient was deemed in                            satisfactory condition to undergo the procedure.                            After obtaining informed consent, the colonoscope                            was passed under direct vision. Throughout the                            procedure, the patient's  blood pressure, pulse, and                            oxygen saturations were monitored continuously. The                            [id number]-[id number] ([id number]) scope was introduced through                            the anus and advanced to the the cecum, identified                            by appendiceal orifice and ileocecal valve. The                            colonoscopy was somewhat difficult due to a                            tortuous colon. Successful completion of the                            procedure was aided by straightening and shortening                            the scope to obtain bowel loop reduction and                            COLOWRAP. The patient tolerated the procedure well.                            The quality of the bowel preparation was good. The                            ileocecal valve, appendiceal orifice, and rectum                            were photographed. Scope In: 11:50:53 AM Scope Out: 12:13:47 PM Scope Withdrawal Time: 0 hours 19 minutes 7 seconds  Total Procedure Duration: 0 hours 22 minutes 54 seconds  Findings:      Five sessile polyps were found in the sigmoid colon(2), mid transverse       colon and proximal  ascending colon(2). The polyps were 2 to 5 mm in       size. These polyps were removed with a cold snare. Resection and       retrieval were complete.      External and internal hemorrhoids were found. The hemorrhoids were       moderate.      The recto-sigmoid colon, sigmoid colon and descending colon were       moderately tortuous. Impression:               - Five 2 to 5 mm polyps in the sigmoid colon, in                            the mid transverse colon and in the proximal                            ascending colon, removed with a cold snare.                            Resected and retrieved.                           - External and internal hemorrhoids.                           - Tortuous colon. Moderate Sedation:       Moderate (conscious) sedation was administered by the endoscopy nurse       and supervised by the endoscopist. The following parameters were       monitored: oxygen saturation, heart rate, blood pressure, and response       to care. Total physician intraservice time was 33 minutes. Recommendation:           - Patient has a contact number available for                            emergencies. The signs and symptoms of potential                            delayed complications were discussed with the                            patient. Return to normal activities tomorrow.                            Written discharge instructions were provided to the                            patient.                           - High fiber diet.                           - Continue present medications.                           - Await pathology results.                           -  Repeat colonoscopy date to be determined after                            pending pathology results are reviewed for                            surveillance. Procedure Code(s):        --- Professional ---                           [phone], Colonoscopy, flexible; with removal of                            tumor(s), polyp(s), or other lesion(s) by snare                            technique                           99153, Moderate sedation; each additional 15                            minutes intraservice time                           G0500, Moderate sedation services provided by the                            same physician or other qualified health care                            professional performing a gastrointestinal                            endoscopic service that sedation supports,                            requiring the presence of an independent trained                            observer to assist in the monitoring of the                            patient's level of consciousness and physiological                             status; initial 15 minutes of intra-service time;                            patient age 48 years or older (additional time may                            be reported with [phone], as appropriate) Diagnosis Code(s):        --- Professional ---  Z12.11, Encounter for screening for malignant                            neoplasm of colon                           K63.5, Polyp of colon                           K64.8, Other hemorrhoids                           Q43.8, Other specified congenital malformations of                            intestine CPT copyright 2019 [employer]. All rights reserved. The codes documented in this report are preliminary and upon coder review may  be revised to meet current compliance requirements. Barney Drain, MD Barney Drain MD, MD 11/25/2018 12:29:06 PM This report has been signed electronically. Number of Addenda: 0

## 2018-11-25 NOTE — Discharge Instructions (Signed)
You had 5 small polyps removed. You have internal and external hemorrhoids.   DRINK WATER TO KEEP YOUR URINE LIGHT YELLOW.  FOLLOW A HIGH FIBER DIET. AVOID ITEMS THAT CAUSE BLOATING & GAS. SEE INFO BELOW.   USE PREPARATION H FOUR TIMES  A DAY IF NEEDED TO RELIEVE RECTAL PAIN/PRESSURE/BLEEDING.   YOUR BIOPSY RESULTS WILL BE BACK IN 5 BUSINESS DAYS.  Next colonoscopy in 5-10 years.    Colonoscopy Care After Read the instructions outlined below and refer to this sheet in the next week. These discharge instructions provide you with general information on caring for yourself after you leave the hospital. While your treatment has been planned according to the most current medical practices available, unavoidable complications occasionally occur. If you have any problems or questions after discharge, call DR. FIELDS, [phone].  ACTIVITY  You may resume your regular activity, but move at a slower pace for the next 24 hours.   Take frequent rest periods for the next 24 hours.   Walking will help get rid of the air and reduce the bloated feeling in your belly (abdomen).   No driving for 24 hours (because of the medicine (anesthesia) used during the test).   You may shower.   Do not sign any important legal documents or operate any machinery for 24 hours (because of the anesthesia used during the test).    NUTRITION  Drink plenty of fluids.   You may resume your normal diet as instructed by your doctor.   Begin with a light meal and progress to your normal diet. Heavy or fried foods are harder to digest and may make you feel sick to your stomach (nauseated).   Avoid alcoholic beverages for 24 hours or as instructed.    MEDICATIONS  You may resume your normal medications.   WHAT YOU CAN EXPECT TODAY  Some feelings of bloating in the abdomen.   Passage of more gas than usual.   Spotting of blood in your stool or on the toilet paper  .  IF YOU HAD POLYPS REMOVED  DURING THE COLONOSCOPY:  Eat a soft diet IF YOU HAVE NAUSEA, BLOATING, ABDOMINAL PAIN, OR VOMITING.    FINDING OUT THE RESULTS OF YOUR TEST Not all test results are available during your visit. DR. Oneida Alar WILL CALL YOU WITHIN 14 DAYS OF YOUR PROCEDUE WITH YOUR RESULTS. Do not assume everything is normal if you have not heard from DR. FIELDS, CALL HER OFFICE AT [phone].  SEEK IMMEDIATE MEDICAL ATTENTION AND CALL THE OFFICE: [phone] IF:  You have more than a spotting of blood in your stool.   Your belly is swollen (abdominal distention).   You are nauseated or vomiting.   You have a temperature over 101F.   You have abdominal pain or discomfort that is severe or gets worse throughout the day.   High-Fiber Diet A high-fiber diet changes your normal diet to include more whole grains, legumes, fruits, and vegetables. Changes in the diet involve replacing refined carbohydrates with unrefined foods. The calorie level of the diet is essentially unchanged. The Dietary Reference Intake (recommended amount) for adult males is 38 grams per day. For adult females, it is 25 grams per day. Pregnant and lactating women should consume 28 grams of fiber per day. Fiber is the intact part of a plant that is not broken down during digestion. Functional fiber is fiber that has been isolated from the plant to provide a beneficial effect in the body.  PURPOSE  Increase  stool bulk.   Ease and regulate bowel movements.   Lower cholesterol.   REDUCE RISK OF COLON CANCER  INDICATIONS THAT YOU NEED MORE FIBER  Constipation and hemorrhoids.   Uncomplicated diverticulosis (intestine condition) and irritable bowel syndrome.   Weight management.   As a protective measure against hardening of the arteries (atherosclerosis), diabetes, and cancer.   GUIDELINES FOR INCREASING FIBER IN THE DIET  Start adding fiber to the diet slowly. A gradual increase of about 5 more grams (2 slices of whole-wheat  bread, 2 servings of most fruits or vegetables, or 1 bowl of high-fiber cereal) per day is best. Too rapid an increase in fiber may result in constipation, flatulence, and bloating.   Drink enough water and fluids to keep your urine clear or pale yellow. Water, juice, or caffeine-free drinks are recommended. Not drinking enough fluid may cause constipation.   Eat a variety of high-fiber foods rather than one type of fiber.   Try to increase your intake of fiber through using high-fiber foods rather than fiber pills or supplements that contain small amounts of fiber.   The goal is to change the types of food eaten. Do not supplement your present diet with high-fiber foods, but replace foods in your present diet.   INCLUDE A VARIETY OF FIBER SOURCES  Replace refined and processed grains with whole grains, canned fruits with fresh fruits, and incorporate other fiber sources. White rice, white breads, and most bakery goods contain little or no fiber.   Brown whole-grain rice, buckwheat oats, and many fruits and vegetables are all good sources of fiber. These include: broccoli, Brussels sprouts, cabbage, cauliflower, beets, sweet potatoes, white potatoes (skin on), carrots, tomatoes, eggplant, squash, berries, fresh fruits, and dried fruits.   Cereals appear to be the richest source of fiber. Cereal fiber is found in whole grains and bran. Bran is the fiber-rich outer coat of cereal grain, which is largely removed in refining. In whole-grain cereals, the bran remains. In breakfast cereals, the largest amount of fiber is found in those with "bran" in their names. The fiber content is sometimes indicated on the label.   You may need to include additional fruits and vegetables each day.   In baking, for 1 cup white flour, you may use the following substitutions:   1 cup whole-wheat flour minus 2 tablespoons.   1/2 cup white flour plus 1/2 cup whole-wheat flour.   Polyps, Colon  A polyp is extra  tissue that grows inside your body. Colon polyps grow in the large intestine. The large intestine, also called the colon, is part of your digestive system. It is a Ringer, hollow tube at the end of your digestive tract where your body makes and stores stool. Most polyps are not dangerous. They are benign. This means they are not cancerous. But over time, some types of polyps can turn into cancer. Polyps that are smaller than a pea are usually not harmful. But larger polyps could someday become or may already be cancerous. To be safe, doctors remove all polyps and test them.   WHO GETS POLYPS? Anyone can get polyps, but certain people are more likely than others. You may have a greater chance of getting polyps if:  You are over 50.   You have had polyps before.   Someone in your family has had polyps.   Someone in your family has had cancer of the large intestine.   Find out if someone in your family has had polyps.  You may also be more likely to get polyps if you:   Eat a lot of fatty foods   Smoke   Drink alcohol   Do not exercise  Eat too much   PREVENTION There is not one sure way to prevent polyps. You might be able to lower your risk of getting them if you:  Eat more fruits and vegetables and less fatty food.   Do not smoke.   Avoid alcohol.   Exercise every day.   Lose weight if you are overweight.   Eating more calcium and folate can also lower your risk of getting polyps. Some foods that are rich in calcium are milk, cheese, and broccoli. Some foods that are rich in folate are chickpeas, kidney beans, and spinach.   Hemorrhoids Hemorrhoids are dilated (enlarged) veins around the rectum. Sometimes clots will form in the veins. This makes them swollen and painful. These are called thrombosed hemorrhoids. Causes of hemorrhoids include:  Constipation.   Straining to have a bowel movement.   HEAVY LIFTING  HOME CARE INSTRUCTIONS  Eat a well balanced diet and drink 6  to 8 glasses of water every day to avoid constipation. You may also use a bulk laxative.   Avoid straining to have bowel movements.   Keep anal area dry and clean.   Do not use a donut shaped pillow or sit on the toilet for Cormier periods. This increases blood pooling and pain.   Move your bowels when your body has the urge; this will require less straining and will decrease pain and pressure.

## 2018-11-26 ENCOUNTER — Telehealth: Payer: Self-pay | Admitting: Gastroenterology

## 2018-11-26 NOTE — Telephone Encounter (Signed)
Reminder in epic °

## 2018-11-26 NOTE — Telephone Encounter (Signed)
Please call pt. SHE HAD TWO SERRATED POLYPS, ONE SIMPLE ADENOMA, ONE HYPERPLASTIC POLYP, AND ONE BENIGN POLYPOID LESION REMOVED.     DRINK WATER TO KEEP YOUR URINE LIGHT YELLOW.  FOLLOW A HIGH FIBER DIET. AVOID ITEMS THAT CAUSE BLOATING & GAS.   USE PREPARATION H FOUR TIMES  A DAY IF NEEDED TO RELIEVE RECTAL PAIN/PRESSURE/BLEEDING.  Next colonoscopy in 5 years.

## 2018-11-28 ENCOUNTER — Encounter (HOSPITAL_COMMUNITY): Payer: Self-pay | Admitting: Gastroenterology

## 2018-11-28 NOTE — Telephone Encounter (Signed)
Pt is aware.  

## 2019-09-17 ENCOUNTER — Other Ambulatory Visit (HOSPITAL_COMMUNITY): Payer: Self-pay | Admitting: Family Medicine

## 2019-09-17 DIAGNOSIS — Z13820 Encounter for screening for osteoporosis: Secondary | ICD-10-CM

## 2019-09-17 DIAGNOSIS — Z1231 Encounter for screening mammogram for malignant neoplasm of breast: Secondary | ICD-10-CM

## 2020-01-08 ENCOUNTER — Other Ambulatory Visit (HOSPITAL_COMMUNITY): Payer: Medicare Other

## 2020-01-22 ENCOUNTER — Other Ambulatory Visit: Payer: Self-pay

## 2020-01-22 ENCOUNTER — Ambulatory Visit (HOSPITAL_COMMUNITY)
Admission: RE | Admit: 2020-01-22 | Discharge: 2020-01-22 | Disposition: A | Discharge status: Home / Self Care | Payer: Medicare PPO | Source: Ambulatory Visit | Attending: Family Medicine | Admitting: Family Medicine

## 2020-01-22 DIAGNOSIS — M85852 Other specified disorders of bone density and structure, left thigh: Secondary | ICD-10-CM | POA: Diagnosis not present

## 2020-01-22 DIAGNOSIS — Z13820 Encounter for screening for osteoporosis: Secondary | ICD-10-CM | POA: Insufficient documentation

## 2020-01-22 DIAGNOSIS — Z780 Asymptomatic menopausal state: Secondary | ICD-10-CM | POA: Diagnosis not present

## 2021-06-10 ENCOUNTER — Emergency Department (HOSPITAL_COMMUNITY): Payer: Medicare PPO

## 2021-06-10 ENCOUNTER — Encounter (HOSPITAL_COMMUNITY): Payer: Self-pay | Admitting: *Deleted

## 2021-06-10 ENCOUNTER — Emergency Department (HOSPITAL_COMMUNITY)
Admission: EM | Admit: 2021-06-10 | Discharge: 2021-06-10 | Disposition: A | Discharge status: Home / Self Care | Payer: Medicare PPO | Attending: Emergency Medicine | Admitting: Emergency Medicine

## 2021-06-10 DIAGNOSIS — R079 Chest pain, unspecified: Secondary | ICD-10-CM | POA: Insufficient documentation

## 2021-06-10 DIAGNOSIS — I10 Essential (primary) hypertension: Secondary | ICD-10-CM | POA: Insufficient documentation

## 2021-06-10 LAB — CBC
HCT: 43 % (ref 36.0–46.0)
Hemoglobin: 13.7 g/dL (ref 12.0–15.0)
MCH: 27.6 pg (ref 26.0–34.0)
MCHC: 31.9 g/dL (ref 30.0–36.0)
MCV: 86.7 fL (ref 80.0–100.0)
Platelets: 262 10*3/uL (ref 150–400)
RBC: 4.96 MIL/uL (ref 3.87–5.11)
RDW: 13.6 % (ref 11.5–15.5)
WBC: 6.2 10*3/uL (ref 4.0–10.5)
nRBC: 0 % (ref 0.0–0.2)

## 2021-06-10 LAB — TROPONIN I (HIGH SENSITIVITY): Troponin I (High Sensitivity): 8 ng/L (ref <18)

## 2021-06-10 LAB — BASIC METABOLIC PANEL
Anion gap: 8 (ref 5–15)
BUN: 13 mg/dL (ref 8–23)
CO2: 29 mmol/L (ref 22–32)
Calcium: 9.7 mg/dL (ref 8.9–10.3)
Chloride: 104 mmol/L (ref 98–111)
Creatinine, Ser: 0.95 mg/dL (ref 0.44–1.00)
GFR, Estimated: >60 mL/min (ref >60)
Glucose, Bld: 86 mg/dL (ref 70–99)
Potassium: 3.6 mmol/L (ref 3.5–5.1)
Sodium: 141 mmol/L (ref 135–145)

## 2021-06-10 MED ORDER — ALUM & MAG HYDROXIDE-SIMETH 200-200-20 MG/5ML PO SUSP
30.0000 mL | Freq: Once | ORAL | Status: AC
  Administered 2021-06-10: 30 mL via ORAL
  Filled 2021-06-10: qty 30

## 2021-06-10 MED ORDER — CYCLOBENZAPRINE HCL 10 MG PO TABS: 10.0000 mg | ORAL_TABLET | Freq: Two times a day (BID) | ORAL | 0 refills | Status: AC | PRN

## 2021-06-10 MED ORDER — LIDOCAINE VISCOUS HCL 2 % MT SOLN
15.0000 mL | Freq: Once | OROMUCOSAL | Status: AC
  Administered 2021-06-10: 15 mL via ORAL
  Filled 2021-06-10: qty 15

## 2021-06-10 NOTE — ED Notes (Signed)
Pt d/c home per MD order. Discharge summary reviewed, pt verbalizes understanding. Ambulatory off unit. No s/s of acute distress noted. Discharged home with spouse.  
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
## 2021-06-10 NOTE — ED Triage Notes (Signed)
Chest pain intermittent for a week
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
## 2021-07-14 ENCOUNTER — Other Ambulatory Visit: Payer: Self-pay

## 2021-07-14 ENCOUNTER — Ambulatory Visit: Payer: Medicare PPO | Admitting: Cardiovascular Disease

## 2021-07-14 ENCOUNTER — Encounter: Payer: Self-pay | Admitting: Cardiovascular Disease

## 2021-07-14 VITALS — BP 130/80 | HR 67 | Ht 66.0 in | Wt 175.4 lb

## 2021-07-14 DIAGNOSIS — R079 Chest pain, unspecified: Secondary | ICD-10-CM | POA: Diagnosis not present

## 2021-07-14 NOTE — Patient Instructions (Addendum)
Testing/Procedures: Exercise Myoview for chest pain  Follow-Up: Follow up as needed with Dr. Johnsie Cancel  Any Other Special Instructions Will Be Listed Below (If Applicable).     If you need a refill on your cardiac medications before your next appointment, please call your pharmacy.

## 2021-07-22 ENCOUNTER — Other Ambulatory Visit (HOSPITAL_COMMUNITY): Payer: Medicare PPO

## 2021-07-22 ENCOUNTER — Encounter (HOSPITAL_COMMUNITY): Payer: Medicare PPO

## 2022-12-25 ENCOUNTER — Emergency Department (HOSPITAL_COMMUNITY)
Admission: EM | Admit: 2022-12-25 | Discharge: 2022-12-25 | Disposition: A | Discharge status: Home / Self Care | Payer: Medicare PPO | Attending: Emergency Medicine | Admitting: Emergency Medicine

## 2022-12-25 ENCOUNTER — Emergency Department (HOSPITAL_COMMUNITY): Payer: Medicare PPO

## 2022-12-25 ENCOUNTER — Other Ambulatory Visit: Payer: Self-pay

## 2022-12-25 DIAGNOSIS — M19042 Primary osteoarthritis, left hand: Secondary | ICD-10-CM | POA: Diagnosis not present

## 2022-12-25 DIAGNOSIS — M79642 Pain in left hand: Secondary | ICD-10-CM | POA: Diagnosis present

## 2022-12-25 NOTE — ED Provider Notes (Signed)
[facility] EMERGENCY DEPARTMENT AT [facility] Provider Note   CSN: [id number] Arrival date & time: 12/25/22  1131     History  Chief Complaint  Patient presents with   Hand Pain   HPI Renad Jenniges is a 72 y.o. female with hypertension presenting for hand pain. Started 2 months ago. Located in the left thumb and radiates to left wrist. Still endorses normal range of motion. Denies redness and swelling. States the pain has just been persistent. States she does have an appt with ortho on 8/20.    Hand Pain       Home Medications Prior to Admission medications   Medication Sig Start Date End Date Taking? Authorizing Provider  Ascorbic Acid (VITAMIN C) 100 MG tablet Take 100 mg by mouth daily.    [provider]  cyclobenzaprine (FLEXERIL) 10 MG tablet Take 10 mg by mouth 3 (three) times daily as needed for muscle spasms.    [provider]  Ergocalciferol (VITAMIN D2) 400 UNITS TABS Take 800 mcg by mouth daily.    [provider]  Multiple Vitamin (MULTIVITAMIN WITH MINERALS) TABS tablet Take 1 tablet by mouth daily.    [provider]  triamterene-hydrochlorothiazide (MAXZIDE-25) 37.5-25 MG per tablet Take 1 tablet by mouth daily. 08/19/14   [provider]      Allergies    Patient has no known allergies.    Review of Systems   See HPI for pertinent positives  Physical Exam Updated Vital Signs BP (!) 143/91   Pulse 67   Temp 98.3 F (36.8 C) (Temporal)   Resp 18   Ht 5\' 4"  (1.626 m)   Wt 78.9 kg   SpO2 100%   BMI 29.87 kg/m  Physical Exam Constitutional:      Appearance: Normal appearance.  HENT:     Head: Normocephalic.     Nose: Nose normal.  Eyes:     Conjunctiva/sclera: Conjunctivae normal.  Pulmonary:     Effort: Pulmonary effort is normal.  Musculoskeletal:     Left hand: Normal. No swelling, deformity or tenderness. Normal range of motion.  Neurological:     Mental Status: She is alert.   Psychiatric:        Mood and Affect: Mood normal.     ED Results / Procedures / Treatments   Labs (all labs ordered are listed, but only abnormal results are displayed) Labs Reviewed - No data to display  EKG None  Radiology DG Hand Complete Left  Result Date: 12/25/2022 CLINICAL DATA:  Left hand pain, primarily involving the thumb joint for the past 2 months. No known injury. EXAM: LEFT HAND - COMPLETE 3+ VIEW COMPARISON:  None Available. FINDINGS: No fracture or dislocation. Very mild distal arthropathy, primarily involving the second through fifth DIP joints with joint space loss, subchondral sclerosis and osteophytosis. Remaining joint spaces appear preserved. No discrete erosions. No evidence of chondrocalcinosis. Regional soft tissues appear normal. No radiopaque foreign body. IMPRESSION: 1. No acute findings with special attention paid to the thumb. 2. Very mild distal arthropathy, presumably degenerative in etiology though conceivably an erosive osteoarthritis could have a similar appearance. Clinical correlation is advised. Electronically Signed   By: Simonne Come M.D.   On: 12/25/2022 12:50    Procedures Procedures    Medications Ordered in ED Medications - No data to display  ED Course/ Medical Decision Making/ A&P  Medical Decision Making Amount and/or Complexity of Data Reviewed Radiology: ordered.   72 year old well-appearing female presenting for hand pain.  Exam was unremarkable of the left thumb. Findings do no indicated infection. X-ray did reveal findings suggestive of osteoarthritis which could be erosive in nature.  Patient refused treatment with ibuprofen.  Advised to follow-up with Dr. Fuller Canada and her PCP.  Vital stable.  Discharged home in good condition.        Final Clinical Impression(s) / ED Diagnoses Final diagnoses:  Osteoarthritis of left hand, unspecified osteoarthritis type    Rx / DC Orders ED  Discharge Orders     None         Gareth Eagle, PA-C 12/25/22 1347    Vanetta Mulders, MD 12/27/22 [phone]

## 2022-12-25 NOTE — Discharge Instructions (Signed)
Evaluation today revealed that you likely have osteoarthritis in your thumb which is likely causing your pain.  Recommend you follow-up with Dr. Fuller Canada and your PCP.

## 2022-12-25 NOTE — ED Triage Notes (Signed)
LEFT thumb pain that radiated to wrist  Ongoing x1 month Has appt 8/20 for ortho

## 2023-01-04 ENCOUNTER — Encounter: Payer: Self-pay | Admitting: Orthopedic Surgery

## 2023-01-04 ENCOUNTER — Ambulatory Visit: Payer: Medicare PPO | Admitting: Orthopedic Surgery

## 2023-01-04 VITALS — BP 113/82 | HR 83 | Ht 64.0 in | Wt 174.0 lb

## 2023-01-04 DIAGNOSIS — M65312 Trigger thumb, left thumb: Secondary | ICD-10-CM | POA: Diagnosis not present

## 2023-01-04 MED ORDER — METHYLPREDNISOLONE ACETATE 40 MG/ML IJ SUSP
40.0000 mg | Freq: Once | INTRAMUSCULAR | Status: AC
  Administered 2023-01-04: 40 mg via INTRA_ARTICULAR

## 2023-01-04 NOTE — Progress Notes (Signed)
Chief Complaint  Patient presents with   Hand Pain    Left thumb pain / catches requests no injections please    72 year old female presents with pain over the volar aspect of her A1 pulley on the left thumb she has tried over-the-counter medications rubs creams exercise ball.  She went to the emergency room x-rays were obtained but only showed arthritis in the DIP joints  She presents for evaluation and management  She does not want an injection  Review of systems the finger does not exhibit numbness tingling or loss of ability to flex or extend  BP 113/82   Pulse 83   Ht 5\' 4"  (1.626 m)   Wt 174 lb (78.9 kg)   BMI 29.87 kg/m   Left thumb there is tenderness and nodularity over the A1 pulley there is flexion extension is intact with a jumping sensation at the IP joint  Neurovascular exam normal  Imaging from the hospital DIP joint of the lesser digits have mild joint space narrowing and osteophyte formation consistent with degenerative arthritis  Diagnosis trigger thumb  I presented her with the treatment options medication, splinting, injection, surgery.  I showed her the AAOS website on trigger finger with pictures and demonstration including a video  She agreed to have the injection done  Left Trigger thumb injection Medication  1 mL of 40 mg Depo-Medrol  2 mL of 1% lidocaine plain  Ethyl chloride for anesthesia  Verbal consent was obtained timeout was taken to confirm the injection site as left thumb  Alcohol was used to prepare the skin along with ethyl chloride and then the injection was made at the A1 pulley there were no complications    She will give it 14 days if no improvement we will try to reinject

## 2023-01-04 NOTE — Patient Instructions (Signed)
You have received an injection of steroids into the joint. 15% of patients will have increased pain within the 24 hours postinjection.   This is transient and will go away.   We recommend that you use ice packs on the injection site for 20 minutes every 2 hours and extra strength Tylenol 2 tablets every 8 as needed until the pain resolves.  If you continue to have pain after taking the Tylenol and using the ice please call the office for further instructions.  

## 2023-01-30 DIAGNOSIS — I10 Essential (primary) hypertension: Secondary | ICD-10-CM | POA: Insufficient documentation

## 2023-01-30 DIAGNOSIS — K219 Gastro-esophageal reflux disease without esophagitis: Secondary | ICD-10-CM | POA: Insufficient documentation

## 2023-04-13 ENCOUNTER — Other Ambulatory Visit (HOSPITAL_COMMUNITY): Payer: Self-pay | Admitting: Family Medicine

## 2023-04-13 DIAGNOSIS — Z13820 Encounter for screening for osteoporosis: Secondary | ICD-10-CM

## 2023-04-25 ENCOUNTER — Ambulatory Visit (HOSPITAL_COMMUNITY)
Admission: RE | Admit: 2023-04-25 | Discharge: 2023-04-25 | Disposition: A | Discharge status: Home / Self Care | Payer: Medicare PPO | Source: Ambulatory Visit | Attending: Family Medicine | Admitting: Family Medicine

## 2023-04-25 DIAGNOSIS — M8589 Other specified disorders of bone density and structure, multiple sites: Secondary | ICD-10-CM | POA: Insufficient documentation

## 2023-04-25 DIAGNOSIS — Z13820 Encounter for screening for osteoporosis: Secondary | ICD-10-CM | POA: Diagnosis present

## 2023-06-05 ENCOUNTER — Ambulatory Visit: Payer: Medicare PPO | Admitting: Podiatry

## 2023-06-19 ENCOUNTER — Encounter: Payer: Self-pay | Admitting: Podiatry

## 2023-06-19 ENCOUNTER — Ambulatory Visit: Payer: Medicare PPO | Admitting: Podiatry

## 2023-06-19 VITALS — Ht 64.0 in | Wt 174.0 lb

## 2023-06-19 DIAGNOSIS — L603 Nail dystrophy: Secondary | ICD-10-CM

## 2023-06-19 NOTE — Progress Notes (Signed)
   Chief Complaint  Patient presents with   Nail Problem    Left foot discoloration on pinky toe    HPI: 73 y.o. female presenting today as a new patient for evaluation of discoloration with thickening to the left fifth toenail plate.  She says that she was seen by a podiatrist in [place], [place] and it was recommended that the nail was avulsed and a nail biopsy obtained due to the discoloration.  She presents today for second opinion.  She does recall history of injuring her toenail plate a few times over the past few months and this is when she began to notice the discoloration to the toenail  Past Medical History:  Diagnosis Date   High blood pressure    Hypertension     Past Surgical History:  Procedure Laterality Date   ABDOMINAL HYSTERECTOMY     COLONOSCOPY N/A 11/25/2018   Procedure: COLONOSCOPY;  Surgeon: Harvey Margo CROME, MD;  Location: [facility];  Service: Endoscopy;  Laterality: N/A;  12:00 - does not want to move up   POLYPECTOMY  11/25/2018   Procedure: POLYPECTOMY;  Surgeon: Harvey Margo CROME, MD;  Location: [facility];  Service: Endoscopy;;  ascending colon, transverse colon, sigmoid colon    No Known Allergies   Physical Exam: General: The patient is alert and oriented x3 in no acute distress.  Dermatology: Skin is warm, dry and supple bilateral lower extremities.  Hyperkeratotic dystrophic hyperpigmented nail plate noted to the left fifth digit with underlying subungual dried blood.  Clinically this looks consistent with history of prior trauma.  There is no tenderness with palpation  Vascular: Palpable pedal pulses bilaterally. Capillary refill within normal limits.  No appreciable edema.  No erythema.  Neurological: Grossly intact via light touch  Musculoskeletal Exam: No pedal deformities noted  Assessment/Plan of Care: 1.  Dystrophic nail plate left fifth toe  -Patient evaluated -Reassured the patient that her nail plate consisted of the looks like a  history of trauma versus any other pathology.  I do not suspect melanoma at this time -The nail plate was debrided today -Recommend good supportive shoes and sneakers that do not constrict the toebox area -Return to clinic as needed       Thresa EMERSON Sar, DPM [facility]  Dr. Thresa EMERSON Sar, DPM    [address] [place], [place] [address]                Office [phone]  Fax [phone]

## 2023-07-08 IMAGING — DX DG CHEST 2V
2 series · 2 of 2 positions shown · non-contrast
Comparison: None.

CLINICAL DATA: Chest pain

EXAM:
CHEST - 2 VIEW

[chest pa]
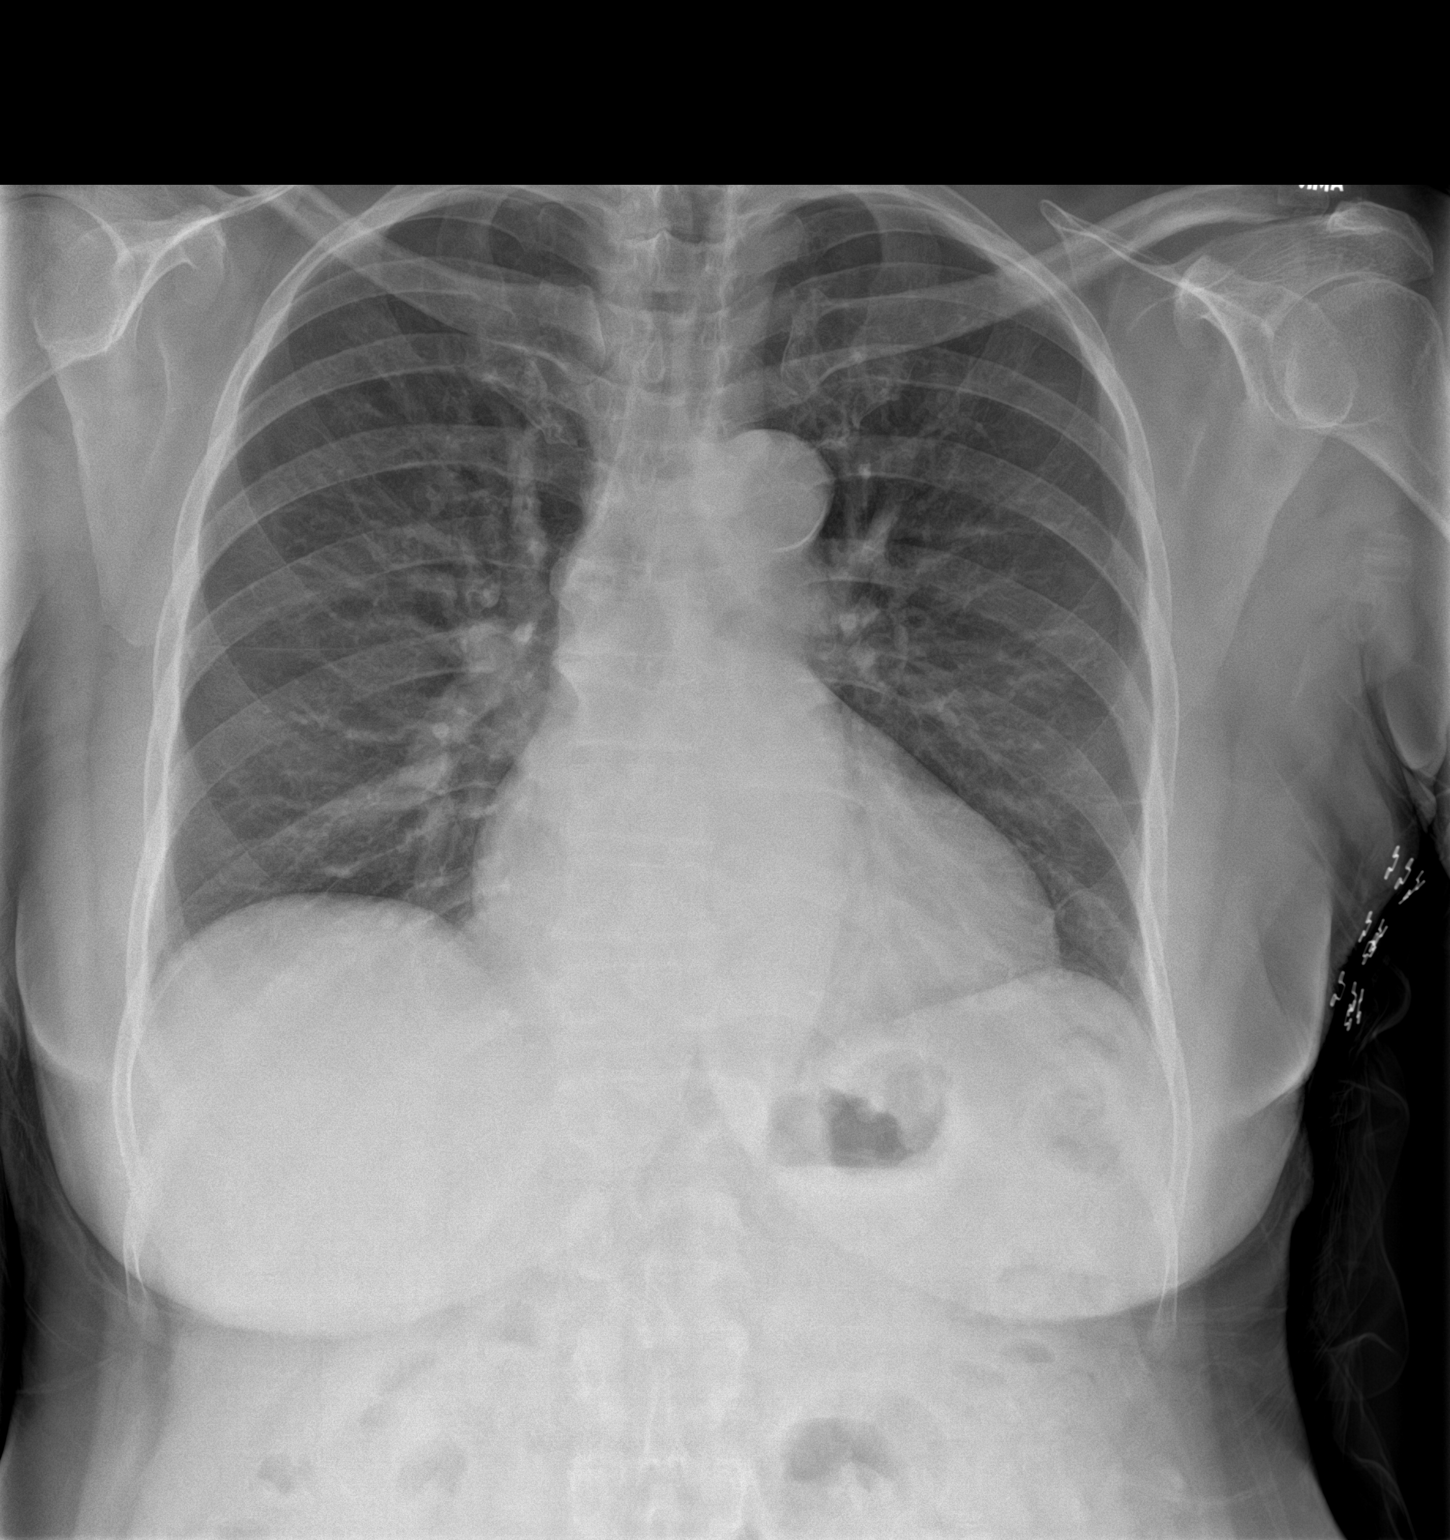

[chest lat]
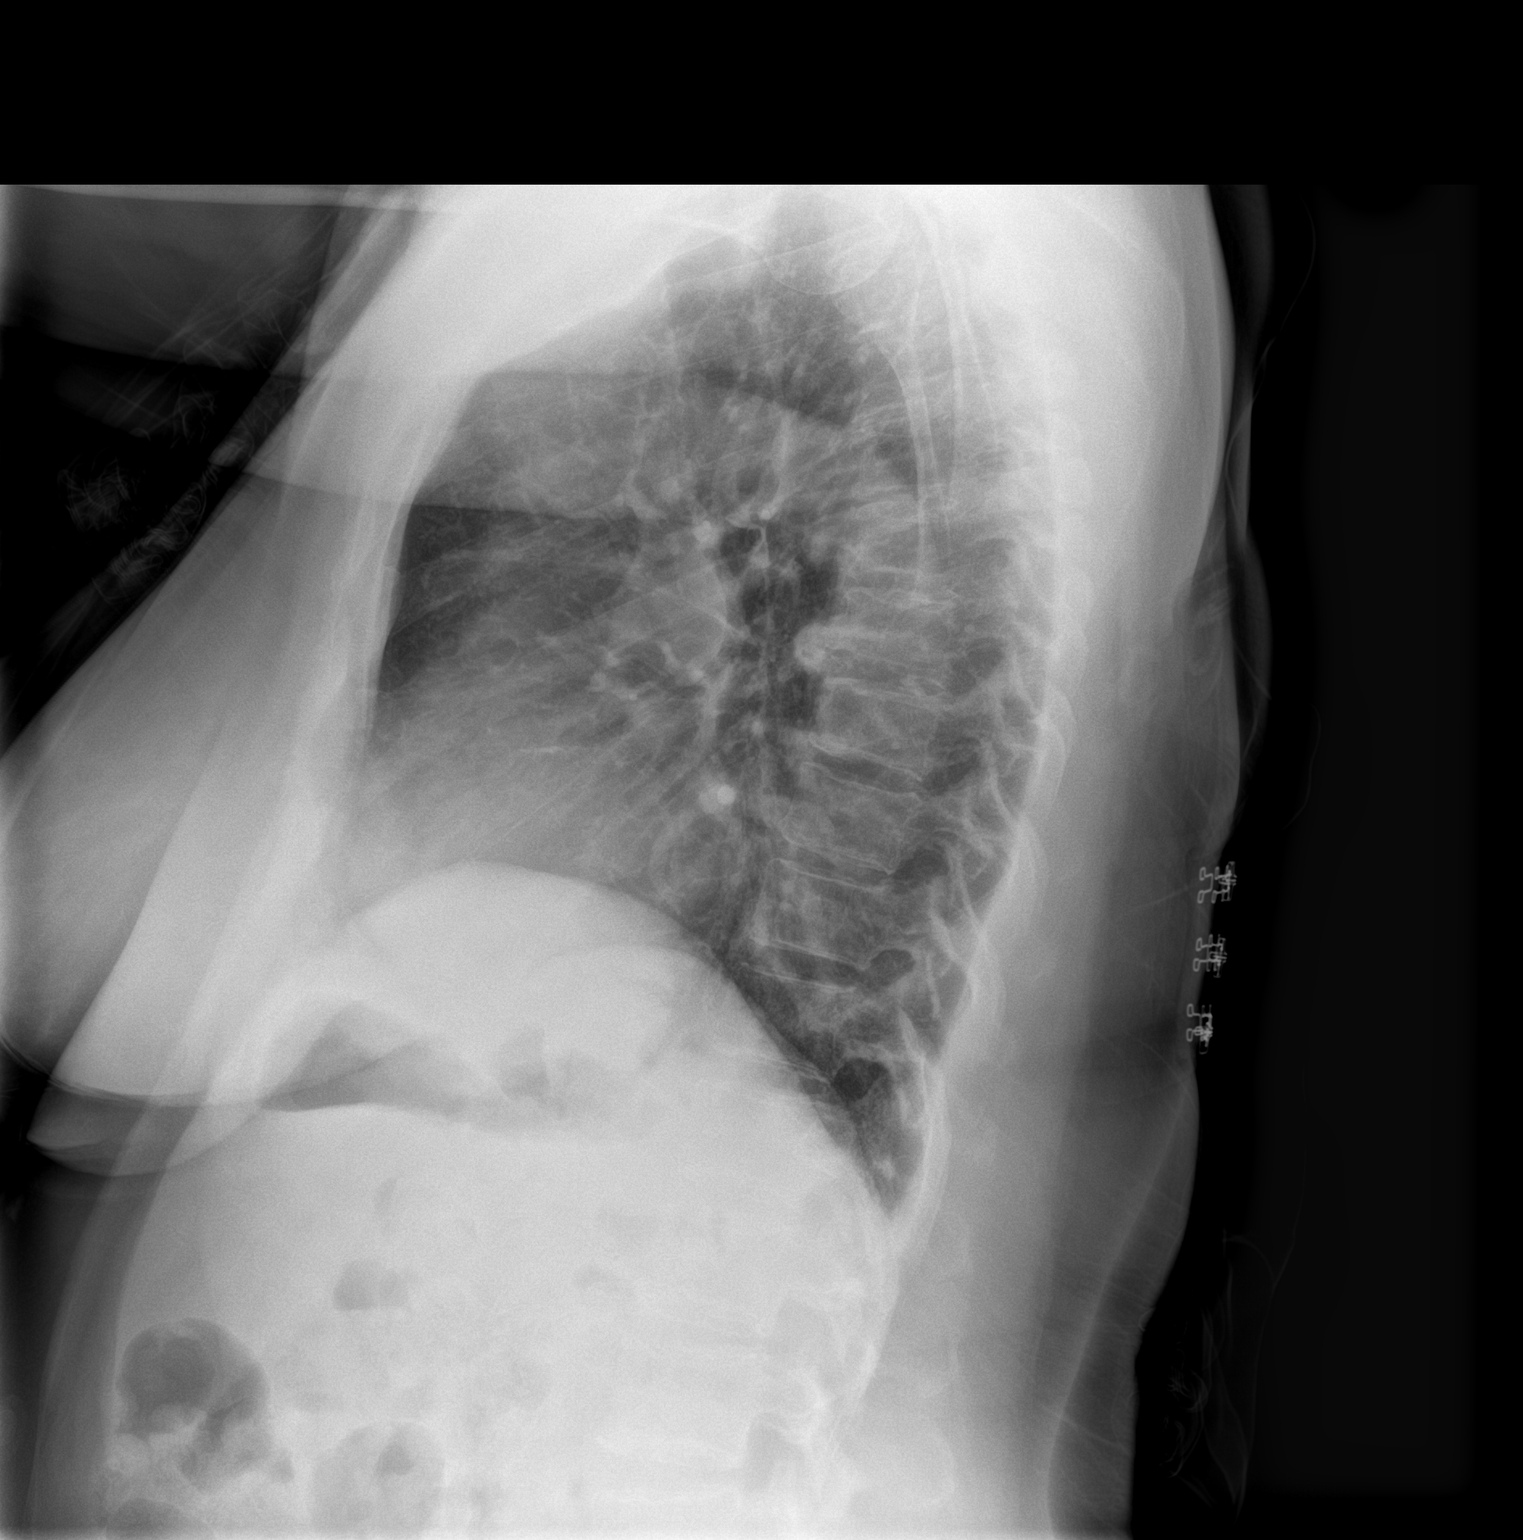

[2 of 2 positions shown; findings below may reference images not displayed]

FINDINGS: The heart size and mediastinal contours are within normal limits.
Both lungs are clear. No pleural effusion or pneumothorax. The
visualized skeletal structures are unremarkable.
IMPRESSION: No active cardiopulmonary disease.

## 2023-09-12 ENCOUNTER — Telehealth: Payer: Self-pay | Admitting: Genetic Counselor

## 2023-09-12 NOTE — Telephone Encounter (Addendum)
 Reviewed patient's previous genetic testing report.  Per [facility], the mutation in BRCA2 previously detected in her daughters (BRCA2  [id number] [phone])) was within reporting range on her testing in 2011.  Family mutation in BRCA2 was NOT detected in her sample at that time.  Discussed with Renee Boone that this mostly likely suggests her daughters' mutation was paternally inherited. 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  Recommended updated genetic testing for her (as her testing included BRCA1/2 only) as well as her husband (her daughters' father).  She said that she will discuss with her husband and call back if they are interested in a genetic counseling appt.  They would like to be seen in [place], if possible.  Also discussed that her [facility] report could be helpful to reclassify the BRCA2 VUS.  Renee Boone gave permission to provide de-identified report to [facility] for purposes of providing data to be used in variant interpretation (daughter has both VUS and mutation in BRCA2; mother's report suggests that these variants are in trans in the absence of Fanconi anemia characteristics)

## 2023-10-05 ENCOUNTER — Encounter: Payer: Self-pay | Admitting: Orthopedic Surgery

## 2023-10-05 ENCOUNTER — Ambulatory Visit: Admitting: Orthopedic Surgery

## 2023-10-05 DIAGNOSIS — M65312 Trigger thumb, left thumb: Secondary | ICD-10-CM

## 2023-10-05 MED ORDER — METHYLPREDNISOLONE ACETATE 40 MG/ML IJ SUSP
40.0000 mg | Freq: Once | INTRAMUSCULAR | Status: AC
  Administered 2023-10-05: 40 mg via INTRA_ARTICULAR

## 2023-10-05 NOTE — Addendum Note (Signed)
 Addended byArla Lab on: 10/05/2023 09:16 AM   Modules accepted: Orders
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
## 2023-10-08 ENCOUNTER — Telehealth: Payer: Self-pay | Admitting: Genetic Counselor

## 2023-10-08 ENCOUNTER — Encounter: Payer: Self-pay | Admitting: Genetic Counselor

## 2023-10-08 NOTE — Telephone Encounter (Signed)
 Patient called to set up appt for updated genetic testing (previously neg for BRCA1/2 only).  Preferred appt at [facility].  Scheduled 6/10 at 1pm.  Appt scheduled with husband (due to PH/FH prostate cancer; BRCA2 mutation in daughter).

## 2023-10-17 ENCOUNTER — Encounter: Payer: Self-pay | Admitting: *Deleted

## 2023-11-13 ENCOUNTER — Inpatient Hospital Stay

## 2023-11-13 ENCOUNTER — Inpatient Hospital Stay: Attending: Oncology | Admitting: Licensed Clinical Social Worker

## 2023-11-13 ENCOUNTER — Other Ambulatory Visit: Payer: Self-pay | Admitting: Licensed Clinical Social Worker

## 2023-11-13 DIAGNOSIS — Z8041 Family history of malignant neoplasm of ovary: Secondary | ICD-10-CM | POA: Diagnosis not present

## 2023-11-13 DIAGNOSIS — Z8481 Family history of carrier of genetic disease: Secondary | ICD-10-CM

## 2023-11-13 DIAGNOSIS — Z803 Family history of malignant neoplasm of breast: Secondary | ICD-10-CM

## 2023-11-13 DIAGNOSIS — Z800 Family history of malignant neoplasm of digestive organs: Secondary | ICD-10-CM | POA: Diagnosis not present

## 2023-11-13 LAB — GENETIC SCREENING ORDER

## 2023-11-14 ENCOUNTER — Encounter: Payer: Self-pay | Admitting: Licensed Clinical Social Worker

## 2023-11-14 NOTE — Progress Notes (Signed)
 REFERRING PROVIDER: Self-referred  PRIMARY PROVIDER:  Ezell Hollow, NP  PRIMARY REASON FOR VISIT:  1. Family history of BRCA2 gene positive   2. Family history of breast cancer   3. Family history of pancreatic cancer   4. Family history of ovarian cancer      HISTORY OF PRESENT ILLNESS:   Renee Boone, a 73 y.o. female, was seen for a [facility] cancer genetics consultation at the request of Dr. No ref. provider found due to a family history of cancer and her daughter's recent genetic testing that showed a BRCA2 pathogenic variant called [id number] ([id number]).   Renee Boone presents to clinic today to discuss the possibility of a hereditary predisposition to cancer, genetic testing, and to further clarify her future cancer risks, as well as potential cancer risks for family members.   CANCER HISTORY:  Renee Boone is a 73 y.o. female with no personal history of cancer.    Renee Boone did undergo genetic testing in 2011 through [facility]:    Per [facility], the mutation in BRCA2 previously detected in her daughters (BRCA2  [id number])) was within reporting range on her testing in 2011.  Family mutation in BRCA2 was NOT detected in her sample at that time.   RISK FACTORS:  Menarche was at age 59.  First live birth at age 34.  Ovaries intact: no/unsure Hysterectomy: yes.  Menopausal status: postmenopausal.  HRT use: 0 years. Colonoscopy: yes; 2020, 5 polyps. Mammogram within the last year: yes. Number of breast biopsies: 0.  Past Medical History:  Diagnosis Date   High blood pressure    Hypertension     Past Surgical History:  Procedure Laterality Date   ABDOMINAL HYSTERECTOMY     COLONOSCOPY N/A 11/25/2018   Procedure: COLONOSCOPY;  Surgeon: Alyce Jubilee, MD;  [facility]: [facility];  Service: Endoscopy;  Laterality: N/A;  12:00 - does not want to move up   POLYPECTOMY  11/25/2018   Procedure: POLYPECTOMY;  Surgeon: Alyce Jubilee, MD;   Location: [facility];  Service: Endoscopy;;  ascending colon, transverse colon, sigmoid colon    FAMILY HISTORY:  We obtained a detailed, 4-generation family history.  Significant diagnoses are listed below: Family History  Problem Relation Age of Onset   Pancreatic cancer Mother 45   Multiple myeloma Father 20   Breast cancer Sister        dx early 28s   Breast cancer Sister        dx early 29s   Breast cancer Maternal Aunt    Ovarian cancer Maternal Aunt    Cancer Paternal Uncle        unk cancers in 3 pat uncles   BRCA 1/2 Daughter        BRCA2 positive   Renee Boone has 3 daughters, two have tested positive for the BRCA2 pathogenic variant [id number] ([id number]). One has reportedly tested negative. Renee Boone has 3 sisters. One had breast cancer at 32 and passed at 69, another had breast cancer at 54 and passed at 45.  Renee Boone mother died of pancreatic cancer at 37. A maternal aunt had breast and blood cancer in her 30s, another aunt had ovarian cancer in her 45s and passed in her 53s, another aunt had an unknown cancer.   Mr. Denson father had multiple myeloma and passed at 21. Three paternal uncles had cancer, unknown types.   Renee Boone is aware of previous family history of genetic testing for hereditary cancer  risks. There is no reported Ashkenazi Jewish ancestry. There is no known consanguinity.     GENETIC COUNSELING ASSESSMENT: Renee Boone is a 73 y.o. female with a family history of a BRCA2 pathogenic variant and a family history of breast/pancreatic/ovarian cancer which is somewhat suggestive of a hereditary cancer syndrome and predisposition to cancer. We, therefore, discussed and recommended the following at today's visit.   DISCUSSION: We discussed that approximately 10% of cancer is hereditary. Most cases of hereditary breast/ovarian/pancreatic cancer are associated with BRCA1/2 genes, although there are other genes associated with hereditary cancer as well.  Cancers and risks are gene specific. Since she had genetic testing that covered the BRCA2 gene in the past, it is unlikely she has the BRCA2 pathogenic variant identified in her daughters. However, she qualifies for updated testing for other genes related to the cancers in her family. We discussed that testing is beneficial for several reasons including knowing about cancer risks, identifying potential screening and risk-reduction options that may be appropriate, and to understand if other family members could be at risk for cancer and allow them to undergo genetic testing.   We reviewed the characteristics, features and inheritance patterns of hereditary cancer syndromes. We also discussed genetic testing, including the appropriate family members to test, the process of testing, insurance coverage and turn-around-time for results. We discussed the implications of a negative, positive and/or variant of uncertain significant result. We recommended Renee Boone pursue genetic testing for the [facility] CancerNext+RNA gene panel.   The CancerNext-Expanded gene panel offered by [facility] and includes sequencing, rearrangement, and RNA analysis for the following 77 genes: AIP, ALK, APC, ATM, AXIN2, BAP1, BARD1, BMPR1A, BRCA1, BRCA2, BRIP1, CDC73, CDH1, CDK4, CDKN1B, CDKN2A, CEBPA, CHEK2, CTNNA1, DDX41, DICER1, ETV6, FH, FLCN, GATA2, LZTR1, MAX, MBD4, MEN1, MET, MLH1, MSH2, MSH3, MSH6, MUTYH, NF1, NF2, NTHL1, PALB2, PHOX2B, PMS2, POT1, PRKAR1A, PTCH1, PTEN, RAD51C, RAD51D, RB1, RET, RPS20, RUNX1, SDHA, SDHAF2, SDHB, SDHC, SDHD, SMAD4, SMARCA4, SMARCB1, SMARCE1, STK11, SUFU, TMEM127, TP53, TSC1, TSC2, VHL, and WT1 (sequencing and deletion/duplication); EGFR, HOXB13, KIT, MITF, PDGFRA, POLD1, and POLE (sequencing only); EPCAM and GREM1 (deletion/duplication only).   Based on Renee Boone's family history of cancer and family history of BRCA2 mutation in her daughter, she meets medical criteria for genetic testing. Despite  that she meets criteria, she may still have an out of pocket cost.   PLAN: After considering the risks, benefits, and limitations, [name] provided informed consent to pursue genetic testing and the blood sample was sent to [facility] for analysis of the CancerNext+RNA panel. Results should be available within approximately 2-3 weeks' time, at which point they will be disclosed by telephone to Ms. Cammon, as will any additional recommendations warranted by these results. Ms. Auker will receive a summary of her genetic counseling visit and a copy of her results once available. This information will also be available in Epic.   Ms. Rapaport questions were answered to her satisfaction today. Our contact information was provided should additional questions or concerns arise. Thank you for the referral and allowing us  to share in the care of your patient.   Valri Gee, MS, [facility] Genetic Counselor [email or web address] Phone: [phone]  40 minutes were spent on the date of the encounter in service to the patient including preparation, face-to-face consultation, documentation and care coordination. Ms. Fana husband Ace Abu was also present. Dr. Nelson Bandy was available for discussion regarding this case.   _______________________________________________________________________ For Office Staff:  Number of people involved in session: 3  Was an Intern/ student involved with case: yes; [employer] intern Moira Andrews was present and assisted with this case.

## 2023-12-11 ENCOUNTER — Ambulatory Visit: Payer: Self-pay | Admitting: Licensed Clinical Social Worker

## 2023-12-11 ENCOUNTER — Telehealth: Payer: Self-pay | Admitting: Licensed Clinical Social Worker

## 2023-12-11 DIAGNOSIS — Z1379 Encounter for other screening for genetic and chromosomal anomalies: Secondary | ICD-10-CM | POA: Insufficient documentation

## 2023-12-11 NOTE — Progress Notes (Signed)
 HPI:   Ms. Firestine was previously seen in the [facility] due to a family history of cancer, her daughter's genetic test result that showed a BRCA2 mutation, and concerns regarding a hereditary predisposition to cancer. Please refer to our prior cancer genetics clinic note for more information regarding our discussion, assessment and recommendations, at the time. Ms. Patane recent genetic test results were disclosed to her, as were recommendations warranted by these results. These results and recommendations are discussed in more detail below.  CANCER HISTORY:  Oncology History   No history exists.    FAMILY HISTORY:  We obtained a detailed, 4-generation family history.  Significant diagnoses are listed below: Family History  Problem Relation Age of Onset   Pancreatic cancer Mother 67   Multiple myeloma Father 30   Breast cancer Sister        dx early 36s   Breast cancer Sister        dx early 40s   Breast cancer Maternal Aunt    Ovarian cancer Maternal Aunt    Cancer Paternal Uncle        unk cancers in 3 pat uncles   BRCA 1/2 Daughter        BRCA2 positive    Ms. Rabalais has 3 daughters, two have tested positive for the BRCA2 pathogenic variant [id number] ([id number]). One has reportedly tested negative. Ms. Seubert has 3 sisters. One had breast cancer at 24 and passed at 59, another had breast cancer at 71 and passed at 35.   Ms. Tagliaferro mother died of pancreatic cancer at 97. A maternal aunt had breast and blood cancer in her 19s, another aunt had ovarian cancer in her 67s and passed in her 56s, another aunt had an unknown cancer.    Mr. Hirsch father had multiple myeloma and passed at 40. Three paternal uncles had cancer, unknown types.    Ms. Elgersma is aware of previous family history of genetic testing for hereditary cancer risks. There is no reported Ashkenazi Jewish ancestry. There is no known consanguinity.       GENETIC TEST RESULTS:  The [facility]  CancerNext+RNA Panel found no pathogenic mutations.   The [facility] CancerNext+RNAinsight Panel includes sequencing, rearrangement analysis, and RNA analysis for the following 40 genes: APC, ATM, BAP1, BARD1, BMPR1A, BRCA1, BRCA2, BRIP1, CDH1, CDKN2A, CHEK2, FH, FLCN, MET, MLH1, MSH2, MSH6, MUTYH, NF1, NTHL1, PALB2, PMS2, PTEN, RAD51C, RAD51D, RPS20, SMAD4, STK11, TP53, TSC1, TSC2, and VHL (sequencing and deletion/duplication); AXIN2, HOXB13, MBD4, MSH3, POLD1 and POLE (sequencing only); EPCAM and GREM1 (deletion/duplication only).   The test report has been scanned into EPIC and is located under the Molecular Pathology section of the Results Review tab.  A portion of the result report is included below for reference. Genetic testing reported out on 12/06/2023.      Even though a pathogenic variant was not identified, possible explanations for the cancer in the family may include: There may be no hereditary risk for cancer in the family. The cancers in Ms. Roulhac and/or her family may be sporadic/familial or due to other genetic and environmental factors. There may be a gene mutation in one of these genes that current testing methods cannot detect but that chance is small. There could be another gene that has not yet been discovered, or that we have not yet tested, that is responsible for the cancer diagnoses in the family.  It is also possible there is a hereditary cause for the cancer in the  family that Ms. Hakala did not inherit. Therefore, it is important to remain in touch with cancer genetics in the future so that we can continue to offer Ms. Szatkowski the most up to date genetic testing.   We recommended Ms. Beauchamp pursue testing for the familial hereditary cancer gene mutation called BRCA2 [id number] [phone]) . Ms. Creque's test was normal and did not reveal the familial mutation. We call this result a true negative result because the cancer-causing mutation was identified in Ms. Hinch's family, and  she did not inherit it.  Given this negative result, Ms. Livolsi chances of developing BRCA2-related cancers are the same as they are in the general population.    ADDITIONAL GENETIC TESTING:  Ms. Nissan genetic testing was fairly extensive.  If there are additional relevant genes identified to increase cancer risk that can be analyzed in the future, we would be happy to discuss and coordinate this testing at that time.    CANCER SCREENING RECOMMENDATIONS:  Ms. Sandell test result is considered negative (normal).  This means that we have not identified a hereditary cause for her family history of cancer at this time and it also did not identify the known familial BRCA2 mutation that was found in her daughter.   An individual's cancer risk and medical management are not determined by genetic test results alone. Overall cancer risk assessment incorporates additional factors, including personal medical history, family history, and any available genetic information that may result in a personalized plan for cancer prevention and surveillance. Therefore, it is recommended she continue to follow the cancer management and screening guidelines provided by her primary healthcare provider.  RECOMMENDATIONS FOR FAMILY MEMBERS:   Since she did not inherit a identifiable mutation in a cancer predisposition gene included on this panel, her children could not have inherited a known mutation from her in one of these genes. Individuals in this family might be at some increased risk of developing cancer, over the general population risk, due to the family history of cancer.  Individuals in the family should notify their providers of the family history of cancer. We recommend women in this family have a yearly mammogram beginning at age 45, or 34 years younger than the earliest onset of cancer, an annual clinical breast exam, and perform monthly breast self-exams.  Family members should have colonoscopies by at age 85, or  earlier, as recommended by their providers. Other members of the family may still carry a pathogenic variant in one of these genes that Ms. Brott did not inherit. Based on the family history, we recommend her sisters/nieces/nephews/maternal relatives have genetic counseling and testing. Ms. Petrasek will let us  know if we can be of any assistance in coordinating genetic counseling and/or testing for this family member.    FOLLOW-UP:  Lastly, we discussed with Ms. Bondar that cancer genetics is a rapidly advancing field and it is possible that new genetic tests will be appropriate for her and/or her family members in the future. We encouraged her to remain in contact with cancer genetics on an annual basis so we can update her personal and family histories and let her know of advances in cancer genetics that may benefit this family.   Our contact number was provided. Ms. Civil questions were answered to her satisfaction, and she knows she is welcome to call us  at anytime with additional questions or concerns.    Dena Cary, MS, [facility] Genetic Counselor [email or web address] Phone: [phone]

## 2023-12-11 NOTE — Telephone Encounter (Signed)
 I contacted Ms. Speelman to discuss her genetic testing results. No pathogenic variants were identified in the 40 genes analyzed. Detailed clinic note to follow.   The test report has been scanned into EPIC and is located under the Molecular Pathology section of the Results Review tab.  A portion of the result report is included below for reference.      Dena Cary, MS, [facility] Genetic Counselor [email or web address] Phone: [phone]

## 2023-12-21 ENCOUNTER — Emergency Department (HOSPITAL_COMMUNITY)

## 2023-12-21 ENCOUNTER — Emergency Department (HOSPITAL_COMMUNITY)
Admission: EM | Admit: 2023-12-21 | Discharge: 2023-12-21 | Disposition: A | Discharge status: Home / Self Care | Attending: Emergency Medicine | Admitting: Emergency Medicine

## 2023-12-21 ENCOUNTER — Encounter (HOSPITAL_COMMUNITY): Payer: Self-pay | Admitting: Emergency Medicine

## 2023-12-21 ENCOUNTER — Other Ambulatory Visit: Payer: Self-pay

## 2023-12-21 DIAGNOSIS — R519 Headache, unspecified: Secondary | ICD-10-CM | POA: Diagnosis present

## 2023-12-21 DIAGNOSIS — I10 Essential (primary) hypertension: Secondary | ICD-10-CM | POA: Diagnosis not present

## 2023-12-21 DIAGNOSIS — Z79899 Other long term (current) drug therapy: Secondary | ICD-10-CM | POA: Insufficient documentation

## 2023-12-21 NOTE — ED Triage Notes (Signed)
 Pt with c/o head pain behind L ear that started yesterday. Denies any injury.

## 2023-12-21 NOTE — ED Provider Notes (Signed)
 [place] EMERGENCY DEPARTMENT AT [facility] Provider Note   CSN: [id number] Arrival date & time: 12/21/23  9462     Patient presents with: Headache   Renee Boone is a 73 y.o. female.   Patient is a 73 year old female with history of hypertension, GERD.  Patient presenting today for evaluation of pain to her head.  Since yesterday, she has experienced a discomfort to her scalp just behind her left ear.  This began in the absence of any injury or trauma.  She describes a constant pain that seems to be improved by rubbing the area.  She denies any associated symptoms such as ear pain, weakness, numbness, visual disturbances.  She denies any fevers or chills.       Prior to Admission medications   Medication Sig Start Date End Date Taking? Authorizing Provider  Ascorbic Acid (VITAMIN C) 100 MG tablet Take 100 mg by mouth daily.    [provider]  Ergocalciferol (VITAMIN D2) 400 UNITS TABS Take 800 mcg by mouth daily.    [provider]  Multiple Vitamin (MULTIVITAMIN WITH MINERALS) TABS tablet Take 1 tablet by mouth daily.    [provider]  triamterene-hydrochlorothiazide (MAXZIDE-25) 37.5-25 MG per tablet Take 1 tablet by mouth daily. 08/19/14   [provider]    Allergies: Patient has no known allergies.    Review of Systems  All other systems reviewed and are negative.   Updated Vital Signs BP (!) 188/100 (BP Location: Left Arm)   Pulse 65   Temp 97.6 F (36.4 C) (Oral)   Resp 18   Ht 5' 4 (1.626 m)   Wt 79.4 kg   SpO2 94%   BMI 30.04 kg/m   Physical Exam Vitals and nursing note reviewed.  Constitutional:      General: She is not in acute distress.    Appearance: She is well-developed. She is not diaphoretic.  HENT:     Head: Normocephalic and atraumatic.     Comments: The tender area is noted just behind the left ear posterior to the mastoid.  No palpable abnormality is noted.  TM and ear canal are  clear. Cardiovascular:     Rate and Rhythm: Normal rate and regular rhythm.     Heart sounds: No murmur heard.    No friction rub. No gallop.  Pulmonary:     Effort: Pulmonary effort is normal. No respiratory distress.     Breath sounds: Normal breath sounds. No wheezing.  Abdominal:     General: Bowel sounds are normal. There is no distension.     Palpations: Abdomen is soft.     Tenderness: There is no abdominal tenderness.  Musculoskeletal:        General: Normal range of motion.     Cervical back: Normal range of motion and neck supple.  Skin:    General: Skin is warm and dry.  Neurological:     General: No focal deficit present.     Mental Status: She is alert and oriented to person, place, and time.     (all labs ordered are listed, but only abnormal results are displayed) Labs Reviewed - No data to display  EKG: None  Radiology: No results found.   Procedures   Medications Ordered in the ED - No data to display  Medical Decision Making Amount and/or Complexity of Data Reviewed Radiology: ordered.   Patient presenting here with complaints of pain to her head/scalp as described in the HPI.  She arrives with stable vital signs and is afebrile.  Physical examination reveals no palpable abnormality.  Head CT shows no acute process.  At this point, I am uncertain as to the cause of this pain, but nothing appears emergent.  I feel as though patient can safely be discharged with outpatient follow-up.     Final diagnoses:  None    ED Discharge Orders     None          Geroldine Berg, MD 12/21/23 [phone]

## 2023-12-21 NOTE — Discharge Instructions (Signed)
 Take Tylenol 1000 mg every 6 hours as needed for pain.  Rest.  Follow-up with primary doctor if symptoms are not improving in the next few days.

## 2024-02-14 ENCOUNTER — Telehealth: Payer: Self-pay

## 2024-02-14 NOTE — Telephone Encounter (Signed)
 Who is your primary care physician: Renee Boone  Reasons for the colonoscopy: HX polyps  Have you had a colonoscopy before?  Yes 11-25-2018  Do you have family history of colon cancer? no  Previous colonoscopy with polyps removed? yes  Do you have a history colorectal cancer?   no  Are you diabetic? If yes, Type 1 or Type 2?    No prediabetes  Do you have a prosthetic or mechanical heart valve? no  Do you have a pacemaker/defibrillator?   no  Have you had endocarditis/atrial fibrillation? no  Have you had joint replacement within the last 12 months?  no  Do you tend to be constipated or have to use laxatives? no  Do you have any history of drugs or alchohol?  no  Do you use supplemental oxygen?  no  Have you had a stroke or heart attack within the last 6 months? no  Do you take weight loss medication?  no  For female patients: have you had a hysterectomy?  yes                                     are you post menopausal?                                                   do you still have your menstrual cycle? no      Do you take any blood-thinning medications such as: (aspirin, warfarin, Plavix, Aggrenox)  no  If yes we need the name, milligram, dosage and who is prescribing doctor  Current Outpatient Medications on File Prior to Visit  Medication Sig Dispense Refill   Cyanocobalamin (B-12 PO) Take 1,000 tablets by mouth daily at 12 noon.     prednisoLONE acetate (PRED FORTE) 1 % ophthalmic suspension 1 drop 4 (four) times daily.     Ergocalciferol (VITAMIN D2) 400 UNITS TABS Take 800 mcg by mouth daily.     Multiple Vitamin (MULTIVITAMIN WITH MINERALS) TABS tablet Take 1 tablet by mouth daily.     triamterene-hydrochlorothiazide (MAXZIDE-25) 37.5-25 MG per tablet Take 1 tablet by mouth daily.     No current facility-administered medications on file prior to visit.    No Known Allergies   Pharmacy: [facility]  Primary  Insurance Name: Mylene Many [id number]  Best number where you can be reached: [phone]

## 2024-02-29 NOTE — Telephone Encounter (Signed)
ASA 2.  ?

## 2024-03-03 ENCOUNTER — Other Ambulatory Visit: Payer: Self-pay | Admitting: *Deleted

## 2024-03-03 ENCOUNTER — Encounter: Payer: Self-pay | Admitting: *Deleted

## 2024-03-03 MED ORDER — NA SULFATE-K SULFATE-MG SULF 17.5-3.13-1.6 GM/177ML PO SOLN: ORAL | 0 refills | Status: DC

## 2024-03-03 NOTE — Telephone Encounter (Signed)
 Pt has been scheduled for 03/28/24 with Dr.Carver. instructions mailed and prep sent to pharmacy.

## 2024-03-03 NOTE — Telephone Encounter (Signed)
 Questionnaire from recall, no referral needed

## 2024-03-28 ENCOUNTER — Ambulatory Visit (HOSPITAL_COMMUNITY)
Admission: RE | Admit: 2024-03-28 | Discharge: 2024-03-28 | Disposition: A | Discharge status: Home / Self Care | Attending: Internal Medicine | Admitting: Internal Medicine

## 2024-03-28 ENCOUNTER — Ambulatory Visit (HOSPITAL_COMMUNITY): Admitting: Anesthesiology

## 2024-03-28 ENCOUNTER — Encounter (HOSPITAL_COMMUNITY)
Admission: RE | Disposition: A | Discharge status: Home / Self Care | Payer: Self-pay | Source: Home / Self Care | Attending: Internal Medicine

## 2024-03-28 ENCOUNTER — Encounter (HOSPITAL_COMMUNITY): Payer: Self-pay | Admitting: Internal Medicine

## 2024-03-28 ENCOUNTER — Other Ambulatory Visit: Payer: Self-pay

## 2024-03-28 DIAGNOSIS — K648 Other hemorrhoids: Secondary | ICD-10-CM | POA: Insufficient documentation

## 2024-03-28 DIAGNOSIS — Q438 Other specified congenital malformations of intestine: Secondary | ICD-10-CM | POA: Insufficient documentation

## 2024-03-28 DIAGNOSIS — D123 Benign neoplasm of transverse colon: Secondary | ICD-10-CM

## 2024-03-28 DIAGNOSIS — Z860101 Personal history of adenomatous and serrated colon polyps: Secondary | ICD-10-CM | POA: Diagnosis not present

## 2024-03-28 DIAGNOSIS — K635 Polyp of colon: Secondary | ICD-10-CM | POA: Diagnosis not present

## 2024-03-28 DIAGNOSIS — Z1211 Encounter for screening for malignant neoplasm of colon: Secondary | ICD-10-CM | POA: Insufficient documentation

## 2024-03-28 DIAGNOSIS — I10 Essential (primary) hypertension: Secondary | ICD-10-CM | POA: Insufficient documentation

## 2024-03-28 HISTORY — PX: COLONOSCOPY: SHX5424

## 2024-03-28 SURGERY — COLONOSCOPY
Anesthesia: General

## 2024-03-28 MED ORDER — LACTATED RINGERS IV SOLN
INTRAVENOUS | Status: DC
Start: 2024-03-28 — End: 2024-03-28

## 2024-03-28 MED ORDER — LIDOCAINE HCL (CARDIAC) PF 100 MG/5ML IV SOSY
PREFILLED_SYRINGE | INTRAVENOUS | Status: DC | PRN
  Administered 2024-03-28: 50 mg via INTRAVENOUS

## 2024-03-28 MED ORDER — PROPOFOL 10 MG/ML IV BOLUS
INTRAVENOUS | Status: DC | PRN
Start: 2024-03-28 — End: 2024-03-28
  Administered 2024-03-28: 100 mg via INTRAVENOUS
  Administered 2024-03-28: 150 ug/kg/min via INTRAVENOUS

## 2024-03-28 NOTE — H&P (Signed)
 Primary Care Physician:  Joshua Rayfield RAMAN, NP Primary Gastroenterologist:  Dr. Cindie  Pre-Procedure History & Physical: HPI:  Renee Boone is a 73 y.o. female is here for a colonoscopy to be performed for surveillance purposes, personal history of adenomatous colon polyps in 2020  Past Medical History:  Diagnosis Date   High blood pressure    Hypertension     Past Surgical History:  Procedure Laterality Date   ABDOMINAL HYSTERECTOMY     COLONOSCOPY N/A 11/25/2018   Procedure: COLONOSCOPY;  Surgeon: Harvey Margo CROME, MD;  Location: [facility];  Service: Endoscopy;  Laterality: N/A;  12:00 - does not want to move up   CORNEAL TRANSPLANT     POLYPECTOMY  11/25/2018   Procedure: POLYPECTOMY;  Surgeon: Harvey Margo CROME, MD;  Location: [facility];  Service: Endoscopy;;  ascending colon, transverse colon, sigmoid colon    Prior to Admission medications   Medication Sig Start Date End Date Taking? Authorizing Provider  Cyanocobalamin (B-12 PO) Take 1,000 tablets by mouth daily at 12 noon.   Yes [provider]  Ergocalciferol (VITAMIN D2) 400 UNITS TABS Take 800 mcg by mouth daily.   Yes [provider]  Multiple Vitamin (MULTIVITAMIN WITH MINERALS) TABS tablet Take 1 tablet by mouth daily.   Yes [provider]  Na Sulfate-K Sulfate-Mg Sulfate concentrate (SUPREP) 17.5-3.13-1.6 GM/177ML SOLN As directed 03/03/24  Yes Dima Mini K, DO  prednisoLONE acetate (PRED FORTE) 1 % ophthalmic suspension 1 drop 4 (four) times daily.   Yes [provider]  triamterene-hydrochlorothiazide (MAXZIDE-25) 37.5-25 MG per tablet Take 1 tablet by mouth daily. 08/19/14  Yes [provider]    Allergies as of 03/03/2024   (No Known Allergies)    Family History  Problem Relation Age of Onset   Pancreatic cancer Mother 7   Multiple myeloma Father 51   Breast cancer Sister        dx early 43s   Breast cancer Sister        dx early 18s   Breast cancer  Maternal Aunt    Ovarian cancer Maternal Aunt    Cancer Paternal Uncle        unk cancers in 3 pat uncles   BRCA 1/2 Daughter        BRCA2 positive    Social History   Socioeconomic History   Marital status: Married    Spouse name: Not on file   Number of children: Not on file   Years of education: Not on file   Highest education level: Not on file  Occupational History   Not on file  Tobacco Use   Smoking status: Never   Smokeless tobacco: Never  Vaping Use   Vaping status: Not on file  Substance and Sexual Activity   Alcohol use: No   Drug use: No   Sexual activity: Not on file  Other Topics Concern   Not on file  Social History Narrative   Not on file   Social Drivers of Health   Financial Resource Strain: Not on file  Food Insecurity: Not on file  Transportation Needs: Not on file  Physical Activity: Not on file  Stress: Not on file  Social Connections: Not on file  Intimate Partner Violence: Not on file    Review of Systems: See HPI, otherwise negative ROS  Physical Exam: Vital signs in last 24 hours: Temp:  [97 F (36.1 C)] 97 F (36.1 C) (10/24 1023) Pulse Rate:  [61]  61 (10/24 1023) Resp:  [19] 19 (10/24 1023) BP: (133)/(80) 133/80 (10/24 1023) SpO2:  [97 %] 97 % (10/24 1023) Weight:  [78.9 kg] 78.9 kg (10/24 1023)   General:   Alert,  Well-developed, well-nourished, pleasant and cooperative in NAD Head:  Normocephalic and atraumatic. Eyes:  Sclera clear, no icterus.   Conjunctiva pink. Ears:  Normal auditory acuity. Nose:  No deformity, discharge,  or lesions. Msk:  Symmetrical without gross deformities. Normal posture. Extremities:  Without clubbing or edema. Neurologic:  Alert and  oriented x4;  grossly normal neurologically. Skin:  Intact without significant lesions or rashes. Psych:  Alert and cooperative. Normal mood and affect.  Impression/Plan: Renee Boone is here for a colonoscopy to be performed for surveillance purposes,  personal history of adenomatous colon polyps in 2020  The risks of the procedure including infection, bleed, or perforation as well as benefits, limitations, alternatives and imponderables have been reviewed with the patient. Questions have been answered. All parties agreeable.

## 2024-03-28 NOTE — Anesthesia Preprocedure Evaluation (Addendum)
 Anesthesia Evaluation  Patient identified by MRN, date of birth, ID band Patient awake    Reviewed: Allergy & Precautions, H&P , NPO status , Patient's Chart, lab work & pertinent test results, reviewed documented beta blocker date and time   Airway Mallampati: II  TM Distance: >3 FB Neck ROM: full    Dental no notable dental hx.    Pulmonary neg pulmonary ROS   Pulmonary exam normal breath sounds clear to auscultation       Cardiovascular Exercise Tolerance: Good hypertension, negative cardio ROS  Rhythm:regular Rate:Normal     Neuro/Psych negative neurological ROS  negative psych ROS   GI/Hepatic negative GI ROS, Neg liver ROS,GERD  ,,  Endo/Other  negative endocrine ROS    Renal/GU negative Renal ROS  negative genitourinary   Musculoskeletal   Abdominal   Peds  Hematology negative hematology ROS (+)   Anesthesia Other Findings   Reproductive/Obstetrics negative OB ROS                             Anesthesia Physical Anesthesia Plan  ASA: 2  Anesthesia Plan: General   Post-op Pain Management:    Induction:   PONV Risk Score and Plan: Propofol infusion  Airway Management Planned:   Additional Equipment:   Intra-op Plan:   Post-operative Plan:   Informed Consent: I have reviewed the patients History and Physical, chart, labs and discussed the procedure including the risks, benefits and alternatives for the proposed anesthesia with the patient or authorized representative who has indicated his/her understanding and acceptance.     Dental Advisory Given  Plan Discussed with: CRNA  Anesthesia Plan Comments:        Anesthesia Quick Evaluation

## 2024-03-28 NOTE — Anesthesia Postprocedure Evaluation (Signed)
 Anesthesia Post Note  Patient: Renee Boone  Procedure(s) Performed: COLONOSCOPY  Patient location during evaluation: Phase II Anesthesia Type: General Level of consciousness: awake Pain management: pain level controlled Vital Signs Assessment: post-procedure vital signs reviewed and stable Respiratory status: spontaneous breathing and respiratory function stable Cardiovascular status: blood pressure returned to baseline and stable Postop Assessment: no headache and no apparent nausea or vomiting Anesthetic complications: no Comments: Late entry   No notable events documented.   Last Vitals:  Vitals:   03/28/24 1023 03/28/24 1114  BP: 133/80 101/67  Pulse: 61 75  Resp: 19 19  Temp: (!) 36.1 C 36.5 C  SpO2: 97% 95%    Last Pain:  Vitals:   03/28/24 1119  TempSrc:   PainSc: 0-No pain                 Yvonna JINNY [name]

## 2024-03-28 NOTE — Discharge Instructions (Addendum)
  Colonoscopy Discharge Instructions  Read the instructions outlined below and refer to this sheet in the next few weeks. These discharge instructions provide you with general information on caring for yourself after you leave the hospital. Your doctor may also give you specific instructions. While your treatment has been planned according to the most current medical practices available, unavoidable complications occasionally occur.   ACTIVITY You may resume your regular activity, but move at a slower pace for the next 24 hours.  Take frequent rest periods for the next 24 hours.  Walking will help get rid of the air and reduce the bloated feeling in your belly (abdomen).  No driving for 24 hours (because of the medicine (anesthesia) used during the test).   Do not sign any important legal documents or operate any machinery for 24 hours (because of the anesthesia used during the test).  NUTRITION Drink plenty of fluids.  You may resume your normal diet as instructed by your doctor.  Begin with a light meal and progress to your normal diet. Heavy or fried foods are harder to digest and may make you feel sick to your stomach (nauseated).  Avoid alcoholic beverages for 24 hours or as instructed.  MEDICATIONS You may resume your normal medications unless your doctor tells you otherwise.  WHAT YOU CAN EXPECT TODAY Some feelings of bloating in the abdomen.  Passage of more gas than usual.  Spotting of blood in your stool or on the toilet paper.  IF YOU HAD POLYPS REMOVED DURING THE COLONOSCOPY: No aspirin products for 7 days or as instructed.  No alcohol for 7 days or as instructed.  Eat a soft diet for the next 24 hours.  FINDING OUT THE RESULTS OF YOUR TEST Not all test results are available during your visit. If your test results are not back during the visit, make an appointment with your caregiver to find out the results. Do not assume everything is normal if you have not heard from your  caregiver or the medical facility. It is important for you to follow up on all of your test results.  SEEK IMMEDIATE MEDICAL ATTENTION IF: You have more than a spotting of blood in your stool.  Your belly is swollen (abdominal distention).  You are nauseated or vomiting.  You have a temperature over 101.  You have abdominal pain or discomfort that is severe or gets worse throughout the day.   Your colonoscopy revealed 1 polyp(s) which I removed successfully. Await pathology results, my office will contact you. Given your age, I do not think you need further colonoscopies for polyp surveillance.    Otherwise follow up with GI as needed.    I hope you have a great rest of your week!  Carlin POUR. Cindie, D.O. Gastroenterology and Hepatology [facility]

## 2024-03-28 NOTE — Op Note (Signed)
 [facility] Patient Name: Renee Boone Procedure Date: 03/28/2024 10:42 AM MRN: [id number] Date of Birth: 03/26/1951 Attending MD: Carlin POUR. Cindie , [place], [id number] CSN: [id number] Age: 73 Admit Type: Outpatient Procedure:                Colonoscopy Indications:              Screening for colorectal malignant neoplasm Providers:                Carlin POUR. Cindie, DO, Devere Lodge, Gordy Lonni Balm, Technician Referring MD:              Medicines:                See the Anesthesia note for documentation of the                            administered medications Complications:            No immediate complications. Estimated Blood Loss:     Estimated blood loss was minimal. Procedure:                Pre-Anesthesia Assessment:                           - The anesthesia plan was to use monitored                            anesthesia care (MAC).                           After obtaining informed consent, the colonoscope                            was passed under direct vision. Throughout the                            procedure, the patient's blood pressure, pulse, and                            oxygen saturations were monitored continuously. The                            [id number] ([id number]) was introduced through the                            anus and advanced to the the cecum, identified by                            appendiceal orifice and ileocecal valve. The                            colonoscopy was somewhat difficult due to a                            redundant colon and significant looping.  Successful                            completion of the procedure was aided by applying                            abdominal pressure. The patient tolerated the                            procedure well. The quality of the bowel                            preparation was evaluated using the BBPS [facility]                            Bowel Preparation Scale)  with scores of: Right                            Colon = 3, Transverse Colon = 3 and Left Colon = 3                            (entire mucosa seen well with no residual staining,                            small fragments of stool or opaque liquid). The                            total BBPS score equals 9. Scope In: 10:57:34 AM Scope Out: 11:10:44 AM Scope Withdrawal Time: 0 hours 6 minutes 52 seconds  Total Procedure Duration: 0 hours 13 minutes 10 seconds  Findings:      Non-bleeding internal hemorrhoids were found.      A 5 mm polyp was found in the transverse colon. The polyp was sessile.       The polyp was removed with a cold snare. Resection and retrieval were       complete.      The exam was otherwise without abnormality. Impression:               - Non-bleeding internal hemorrhoids.                           - One 5 mm polyp in the transverse colon, removed                            with a cold snare. Resected and retrieved.                           - The examination was otherwise normal. Moderate Sedation:      Per Anesthesia Care Recommendation:           - Patient has a contact number available for                            emergencies. The signs and symptoms of potential  delayed complications were discussed with the                            patient. Return to normal activities tomorrow.                            Written discharge instructions were provided to the                            patient.                           - Resume previous diet.                           - Continue present medications.                           - Await pathology results.                           - No repeat colonoscopy due to age.                           - Return to GI clinic PRN. Procedure Code(s):        --- Professional ---                           [phone], Colonoscopy, flexible; with removal of                            tumor(s), polyp(s), or  other lesion(s) by snare                            technique Diagnosis Code(s):        --- Professional ---                           Z12.11, Encounter for screening for malignant                            neoplasm of colon                           D12.3, Benign neoplasm of transverse colon (hepatic                            flexure or splenic flexure)                           K64.8, Other hemorrhoids CPT copyright 2022 [employer]. All rights reserved. The codes documented in this report are preliminary and upon coder review may  be revised to meet current compliance requirements. Carlin POUR. Cindie, DO Carlin POUR. Allyson Tineo, DO 03/28/2024 11:14:01 AM This report has been signed electronically. Number of Addenda: 0

## 2024-03-28 NOTE — Transfer of Care (Signed)
 Immediate Anesthesia Transfer of Care Note  Patient: Renee Boone  Procedure(s) Performed: COLONOSCOPY  Patient Location: Endoscopy Unit  Anesthesia Type:General  Level of Consciousness: awake, alert , oriented, and patient cooperative  Airway & Oxygen Therapy: Patient Spontanous Breathing  Post-op Assessment: Report given to RN and Post -op Vital signs reviewed and stable  Post vital signs: Reviewed and stable  Last Vitals:  Vitals Value Taken Time  BP 101/67 03/28/24 11:14  Temp 36.5 C 03/28/24 11:14  Pulse 75 03/28/24 11:14  Resp 19 03/28/24 11:14  SpO2 95 % 03/28/24 11:14    Last Pain:  Vitals:   03/28/24 1114  TempSrc: Oral  PainSc:          Complications: No notable events documented.

## 2024-03-31 ENCOUNTER — Encounter (HOSPITAL_COMMUNITY): Payer: Self-pay | Admitting: Internal Medicine

## 2024-03-31 LAB — SURGICAL PATHOLOGY

## 2024-04-13 ENCOUNTER — Ambulatory Visit: Payer: Self-pay | Admitting: Internal Medicine
